# Patient Record
Sex: Male | Born: 1935 | Race: White | Hispanic: No | Marital: Married | Smoking: Former smoker
Health system: Southern US, Community
[De-identification: ages and names within clinical notes are randomized; demographics above are authoritative.]

## PROBLEM LIST (undated history)

## (undated) DIAGNOSIS — J189 Pneumonia, unspecified organism: Secondary | ICD-10-CM

## (undated) DIAGNOSIS — I10 Essential (primary) hypertension: Secondary | ICD-10-CM

## (undated) DIAGNOSIS — N400 Enlarged prostate without lower urinary tract symptoms: Secondary | ICD-10-CM

## (undated) DIAGNOSIS — G609 Hereditary and idiopathic neuropathy, unspecified: Secondary | ICD-10-CM

## (undated) DIAGNOSIS — I2089 Other forms of angina pectoris: Secondary | ICD-10-CM

## (undated) DIAGNOSIS — F0390 Unspecified dementia without behavioral disturbance: Secondary | ICD-10-CM

## (undated) DIAGNOSIS — M719 Bursopathy, unspecified: Secondary | ICD-10-CM

## (undated) DIAGNOSIS — I2510 Atherosclerotic heart disease of native coronary artery without angina pectoris: Secondary | ICD-10-CM

## (undated) DIAGNOSIS — E785 Hyperlipidemia, unspecified: Secondary | ICD-10-CM

## (undated) DIAGNOSIS — C4490 Unspecified malignant neoplasm of skin, unspecified: Secondary | ICD-10-CM

## (undated) DIAGNOSIS — I639 Cerebral infarction, unspecified: Secondary | ICD-10-CM

## (undated) DIAGNOSIS — I208 Other forms of angina pectoris: Secondary | ICD-10-CM

## (undated) DIAGNOSIS — G629 Polyneuropathy, unspecified: Secondary | ICD-10-CM

## (undated) HISTORY — PX: CORONARY ANGIOPLASTY: SHX604

## (undated) HISTORY — PX: INGUINAL HERNIA REPAIR: SUR1180

---

## 1985-12-24 HISTORY — PX: APPENDECTOMY: SHX54

## 1991-12-25 HISTORY — PX: TRANSURETHRAL RESECTION OF PROSTATE: SHX73

## 1995-12-25 HISTORY — PX: CORONARY ARTERY BYPASS GRAFT: SHX141

## 2001-02-20 ENCOUNTER — Encounter: Payer: Self-pay | Admitting: Cardiothoracic Surgery

## 2001-02-20 ENCOUNTER — Encounter: Admission: RE | Admit: 2001-02-20 | Discharge: 2001-02-20 | Payer: Self-pay | Admitting: Cardiothoracic Surgery

## 2001-02-21 ENCOUNTER — Encounter: Payer: Self-pay | Admitting: Cardiothoracic Surgery

## 2001-02-21 HISTORY — PX: RECONSTRUCTIVE REPAIR STERNAL: SUR1094

## 2001-02-25 ENCOUNTER — Inpatient Hospital Stay (HOSPITAL_COMMUNITY): Admission: RE | Admit: 2001-02-25 | Discharge: 2001-02-27 | Payer: Self-pay | Admitting: Cardiothoracic Surgery

## 2001-02-25 ENCOUNTER — Encounter: Payer: Self-pay | Admitting: Cardiothoracic Surgery

## 2001-02-26 ENCOUNTER — Encounter: Payer: Self-pay | Admitting: Cardiothoracic Surgery

## 2001-02-27 ENCOUNTER — Encounter: Payer: Self-pay | Admitting: Cardiothoracic Surgery

## 2001-08-07 ENCOUNTER — Encounter: Payer: Self-pay | Admitting: Cardiothoracic Surgery

## 2001-08-07 ENCOUNTER — Encounter: Admission: RE | Admit: 2001-08-07 | Discharge: 2001-08-07 | Payer: Self-pay | Admitting: Cardiothoracic Surgery

## 2002-02-02 ENCOUNTER — Observation Stay (HOSPITAL_COMMUNITY): Admission: RE | Admit: 2002-02-02 | Discharge: 2002-02-03 | Payer: Self-pay | Admitting: Surgery

## 2002-02-02 ENCOUNTER — Encounter (INDEPENDENT_AMBULATORY_CARE_PROVIDER_SITE_OTHER): Payer: Self-pay | Admitting: Specialist

## 2002-02-02 ENCOUNTER — Encounter: Payer: Self-pay | Admitting: Surgery

## 2004-11-02 ENCOUNTER — Ambulatory Visit (HOSPITAL_COMMUNITY): Admission: RE | Admit: 2004-11-02 | Discharge: 2004-11-02 | Payer: Self-pay | Admitting: *Deleted

## 2004-11-02 ENCOUNTER — Encounter (INDEPENDENT_AMBULATORY_CARE_PROVIDER_SITE_OTHER): Payer: Self-pay | Admitting: *Deleted

## 2005-05-24 ENCOUNTER — Encounter: Admission: RE | Admit: 2005-05-24 | Discharge: 2005-05-24 | Payer: Self-pay | Admitting: Cardiothoracic Surgery

## 2006-04-17 ENCOUNTER — Emergency Department (HOSPITAL_COMMUNITY): Admission: EM | Admit: 2006-04-17 | Discharge: 2006-04-17 | Payer: Self-pay | Admitting: Emergency Medicine

## 2008-11-17 ENCOUNTER — Emergency Department (HOSPITAL_COMMUNITY): Admission: EM | Admit: 2008-11-17 | Discharge: 2008-11-18 | Payer: Self-pay | Admitting: Emergency Medicine

## 2008-12-06 ENCOUNTER — Observation Stay (HOSPITAL_COMMUNITY): Admission: EM | Admit: 2008-12-06 | Discharge: 2008-12-07 | Payer: Self-pay | Admitting: Emergency Medicine

## 2010-02-23 ENCOUNTER — Encounter: Admission: RE | Admit: 2010-02-23 | Discharge: 2010-03-30 | Payer: Self-pay | Admitting: Neurology

## 2011-02-28 ENCOUNTER — Inpatient Hospital Stay (HOSPITAL_COMMUNITY)
Admission: EM | Admit: 2011-02-28 | Discharge: 2011-03-02 | DRG: 066 | Disposition: A | Discharge status: Home Health | Payer: Medicare Other | Attending: Internal Medicine | Admitting: Internal Medicine

## 2011-02-28 ENCOUNTER — Emergency Department (HOSPITAL_COMMUNITY): Payer: Medicare Other

## 2011-02-28 DIAGNOSIS — I2510 Atherosclerotic heart disease of native coronary artery without angina pectoris: Secondary | ICD-10-CM | POA: Diagnosis present

## 2011-02-28 DIAGNOSIS — H532 Diplopia: Secondary | ICD-10-CM | POA: Diagnosis present

## 2011-02-28 DIAGNOSIS — E119 Type 2 diabetes mellitus without complications: Secondary | ICD-10-CM | POA: Diagnosis present

## 2011-02-28 DIAGNOSIS — N400 Enlarged prostate without lower urinary tract symptoms: Secondary | ICD-10-CM | POA: Diagnosis present

## 2011-02-28 DIAGNOSIS — Z7902 Long term (current) use of antithrombotics/antiplatelets: Secondary | ICD-10-CM

## 2011-02-28 DIAGNOSIS — G609 Hereditary and idiopathic neuropathy, unspecified: Secondary | ICD-10-CM | POA: Diagnosis present

## 2011-02-28 DIAGNOSIS — I10 Essential (primary) hypertension: Secondary | ICD-10-CM | POA: Diagnosis present

## 2011-02-28 DIAGNOSIS — Z951 Presence of aortocoronary bypass graft: Secondary | ICD-10-CM

## 2011-02-28 DIAGNOSIS — E785 Hyperlipidemia, unspecified: Secondary | ICD-10-CM | POA: Diagnosis present

## 2011-02-28 DIAGNOSIS — I6330 Cerebral infarction due to thrombosis of unspecified cerebral artery: Principal | ICD-10-CM | POA: Diagnosis present

## 2011-02-28 LAB — CBC
HCT: 42.4 % (ref 39.0–52.0)
Hemoglobin: 14.7 g/dL (ref 13.0–17.0)
Hemoglobin: 14 g/dL (ref 13.0–17.0)
MCHC: 33.5 g/dL (ref 30.0–36.0)
MCHC: 33 g/dL (ref 30.0–36.0)
MCV: 94.4 fL (ref 78.0–100.0)
Platelets: 222 10*3/uL (ref 150–400)
Platelets: 230 10*3/uL (ref 150–400)
RBC: 4.65 MIL/uL (ref 4.22–5.81)
RBC: 4.5 MIL/uL (ref 4.22–5.81)
RDW: 13.5 % (ref 11.5–15.5)
RDW: 13.4 % (ref 11.5–15.5)
WBC: 11 10*3/uL — ABNORMAL HIGH (ref 4.0–10.5)

## 2011-02-28 LAB — COMPREHENSIVE METABOLIC PANEL
AST: 19 U/L (ref 0–37)
Albumin: 3.6 g/dL (ref 3.5–5.2)
Albumin: 3.5 g/dL (ref 3.5–5.2)
BUN: 17 mg/dL (ref 6–23)
CO2: 22 mEq/L (ref 19–32)
CO2: 25 mEq/L (ref 19–32)
Calcium: 8.5 mg/dL (ref 8.4–10.5)
Calcium: 8.5 mg/dL (ref 8.4–10.5)
Chloride: 107 mEq/L (ref 96–112)
Creatinine, Ser: 1.25 mg/dL (ref 0.4–1.5)
GFR calc Af Amer: >60 mL/min (ref >60)
GFR calc non Af Amer: 53 mL/min — ABNORMAL LOW (ref >60)
GFR calc non Af Amer: 55 mL/min — ABNORMAL LOW (ref >60)
Potassium: 3.9 mEq/L (ref 3.5–5.1)
Sodium: 138 mEq/L (ref 135–145)
Total Bilirubin: 0.9 mg/dL (ref 0.3–1.2)

## 2011-02-28 LAB — DIFFERENTIAL
Basophils Absolute: 0 10*3/uL (ref 0.0–0.1)
Basophils Relative: 0 % (ref 0–1)
Lymphocytes Relative: 11 % — ABNORMAL LOW (ref 12–46)
Lymphs Abs: 1.3 10*3/uL (ref 0.7–4.0)
Monocytes Relative: 6 % (ref 3–12)

## 2011-02-28 LAB — POCT I-STAT, CHEM 8
Creatinine, Ser: 1.4 mg/dL (ref 0.4–1.5)
HCT: 45 % (ref 39.0–52.0)
TCO2: 21 mmol/L (ref 0–100)

## 2011-02-28 LAB — CK TOTAL AND CKMB (NOT AT ARMC)
CK, MB: 3.3 ng/mL (ref 0.3–4.0)
Total CK: 82 U/L (ref 7–232)

## 2011-02-28 LAB — TROPONIN I: Troponin I: 0.01 ng/mL (ref 0.00–0.06)

## 2011-02-28 LAB — CARDIAC PANEL(CRET KIN+CKTOT+MB+TROPI): Total CK: 66 U/L (ref 7–232)

## 2011-02-28 LAB — PROTIME-INR
INR: 1.13 (ref 0.00–1.49)
Prothrombin Time: 14.7 seconds (ref 11.6–15.2)
Prothrombin Time: 14.6 seconds (ref 11.6–15.2)

## 2011-02-28 LAB — APTT: aPTT: 27 seconds (ref 24–37)

## 2011-03-01 ENCOUNTER — Inpatient Hospital Stay (HOSPITAL_COMMUNITY): Payer: Medicare Other

## 2011-03-01 LAB — LIPID PANEL
HDL: 37 mg/dL — ABNORMAL LOW (ref >39)
LDL Cholesterol: 70 mg/dL (ref 0–99)
Total CHOL/HDL Ratio: 3.2 RATIO
Triglycerides: 51 mg/dL (ref <150)
VLDL: 10 mg/dL (ref 0–40)

## 2011-03-01 LAB — HEMOGLOBIN A1C: Hgb A1c MFr Bld: 6.3 % — ABNORMAL HIGH (ref <5.7)

## 2011-03-01 NOTE — H&P (Signed)
NAME:  Nathan Willis, Nathan Willis NO.:  [id number]  MEDICAL RECORD NO.:  [id number]           PATIENT TYPE:  E  LOCATION:  [facility]                         FACILITY:  [facility]  PHYSICIAN:  Vania Rea, M.D. DATE OF BIRTH:  1922/06/08  DATE OF ADMISSION:  02/28/2011 DATE OF DISCHARGE:                             HISTORY & PHYSICAL   PRIMARY CARE PHYSICIAN:  Caryn Bee L. Little, M.D. with [facility].  CARDIOLOGIST:  Dr. Katrinka Blazing with [facility].  UROLOGIST:  Dr. Aldean Ast, [facility].  CHIEF COMPLAINT:  Acute onset double vision and ataxia.  HISTORY OF PRESENT ILLNESS:  This is an 77 year old Caucasian gentleman who while watching television this morning, noticed at about 9:00 a.m., he had sudden onset of double vision.  The patient could only see clearly by keeping one eye closed.  He had no headache.  No ataxia.  No chest pain, shortness of breath, dizziness associated with this. Despite this, the patient continued with his normal activities and in fact drove his wife to church and back and then on going back to pick up his wife when he got out of the car, noticed that he had difficulty maintaining his balance and fell.  According to his wife, he was walking with one eye closed and was staggering around in circles.  He fell. There was something wrong with his eyes and they called his ophthalmologic to make an appointment and an appointment was made for the following day.  As his wife felt he was getting worse, they called back and managed to get and see the doctor at 1 p.m. today.  On arrival to the doctor, he was evaluated and told by the doctor who felt he was having a stroke and transferred to [facility] by ambulance.  Because the full history has not been obtained, the patient was felt to be at cold stroke, but on initial evaluation in the Emergency Room, he was felt he was out of the window for TPA.  An MRI was ordered and the hospitalist service was called to  assist with further management.  Other than the double vision and ataxia, the patient denies any other acute problems.  He has no focal lateralizing weakness.  He is having no disorder of speech nor disorder of swallowing.  He has been having no fever, cough, or cold.  PAST MEDICAL HISTORY: 1. Coronary artery disease status post CABG in 1997. 2. 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 3. Peripheral neuropathy. 4. Recent diagnosis of borderline diabetes. 5. Benign prostatic hypertrophy, status post TURP. 6. Hyperlipidemia. 7. Hypertension. 8. Status post appendectomy, status post hernia repair.  MEDICATIONS:  Include: 1. Metoprolol succinate 25 mg daily. 2. Imdur 120 mg daily. 3. Finasteride 5 mg daily. 4. Niaspan 1000 mg at bedtime.  ALLERGIES:  MORPHINE causes him to become very aggressive and violent. He is also allergic to CODEINE, LIPITOR, ZOCOR, BAYCOL and LEXAPRO.  SOCIAL HISTORY: 1. Denies tobacco, alcohol, or illicit drug use.  Discontinued alcohol     over 15 years ago.  Used to drink occasionally. 2. He has worked  for 28 years at [employer].  Now     of course retired and living with his elderly wife.  FAMILY HISTORY:  He is an only child and his parents were very healthy.  REVIEW OF SYSTEMS:  Other than noted above significant only for peripheral neuropathy.  He has recently noted increased lower extremity edema bilaterally.  PHYSICAL EXAMINATION:  GENERAL:  A very pleasant elderly Caucasian gentleman, lying on the stretcher. VITAL SIGNS:  Temperature is 98.7, pulse 78, respiration 16, blood pressure 122/52.  He is saturating at 96% on 2 L.  He is alert and oriented x4. HEENT:  His pupils round and equal about 2 mm and reactive.  No cervical lymphadenopathy.  No thyromegaly.  He does have prominent what appear to be slightly glands, submandibular bilaterally.  No carotid bruit. CHEST:  Clear to auscultation  bilaterally. CARDIOVASCULAR:  He does have an irregular rhythm. ABDOMEN:  Obese, soft, nontender.  No masses.  Normoactive bowel sounds. EXTREMITIES: He has arthritic deformities of the hands and knees.  He has 1+ soft pitting edema up to lower to third of his leg.  Dorsalis pedis pulses at 2+ and bounding. SKIN:  Warm, dry.  He has abrasions of his right forearm where he fell and he has a bruise on also.  An old bruise on his left forearm. CENTRAL NERVOUS SYSTEM:  No field the deficit was elicited.  However, he had difficulty with focused because of his apparent double vision and was much better with one eyes closed.  He has no facial asymmetry. Cranial nerves III through XII appear grossly intact.  He has no focal lateralizing signs in his upper or lower extremity.  LABORATORY DATA:  His white count is 11.4, hemoglobin 14.7, platelets 222.  He has 83% neutrophils.  Absolute neutrophil count elevated to 9.5.  His sodium is 134, potassium 4.4, chloride 107, CO2 of 22, glucose elevated at 153, BUN 20, creatinine 1.28.  Total protein is 5.8, albumin 3.6.  Calcium is 8.5.  His liver functions otherwise unremarkable.  His cardiac enzymes completely normal.  MRI of the brain showed no acute findings ventricular size and if the bases were normal for his age.  MRI of the brain shows cerebral acute non hemorrhagic infarct involving the posterior circulation distribution including the occipital lobe bilaterally.  The right thalamus, the medial posterior left temporal lobe, and the left cerebellum.  MRA of his head showed intracranial atherosclerotic type changes, questionable occlusion of the left vertebral artery dissection cannot be completely excluded to the cause of his appear normal.  ASSESSMENT: 1. 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 2. Diplopia and ataxia due to the above. 3. Hypertension. 4. Borderline diabetes. 5. Coronary artery  disease. 6. Benign prostatic hypertrophy. 7. Peripheral neuropathy. 8. Lower extremity edema.  PLAN:  This relatively healthy gentleman will be admitted to the Neuro Telemetry Unit.  We will give aspirin for antiplatelet therapy and I have discussed his MRI with the neuro hospitalist and they will continue to follow the patient in hospital.  We will continue his cardiac medications in fact all his chronic home medications and will hydrate him.  We will cautiously and will check a 2-D echo.  Other plans as per orders.     Vania Rea, M.D.     LC/MEDQ  D:  02/28/2011  T:  02/28/2011  Job:  [id number]  cc:   Caryn Bee L. Little, M.D. Lyn Records,  M.D. Levie Heritage, MD  Electronically Signed by Vania Rea M.D. on 03/01/2011 02:36:15 AM

## 2011-03-02 NOTE — Consult Note (Addendum)
NAME:  Nathan Willis, HESSLING NO.:  [id number]  MEDICAL RECORD NO.:  [id number]           PATIENT TYPE:  I  LOCATION:  3004                         FACILITY:  [facility]  PHYSICIAN:  Levie Heritage, MD       DATE OF BIRTH:  07-05-1922  DATE OF CONSULTATION:  02/28/2011 DATE OF DISCHARGE:                                CONSULTATION   REASON FOR CONSULTATION:  Rule out acute stroke/code stroke.  CHIEF COMPLAINT:  Sudden onset of double vision.  HISTORY OF PRESENT ILLNESS:  This patient is a 77 year old man with multiple comorbidities who was at his baseline around 9:00 a.m. this morning while watching TV, he suddenly noticed that he is having horizontal double vision off and on.  He did not notice much of any other changes, but when he started to walk, he felt little wobbly and actually he had a fall on his left side.  He and his wife did not observe any other weakness or sensory changes, so thinking of the eye problem he drove himself to his eye Dr. Nile Riggs who referred him back here to be [facility].  The symptom onset was 9 a.m. or around that time, and he was evaluated by myself at 2 p.m. and before that he had a negative CAT scan of the head in the emergency and his symptoms of the vision had resolved.  PAST MEDICAL HISTORY:  Coronary artery disease, hypercholesterolemia, benign prostatic hypertrophy, hypertension, neuropathy, he has had hernia repair, and appendectomy in remote past.  SOCIAL HISTORY:  He lives with his wife.  He is a retired Occupational hygienist, and denies illicit drug abuse, alcohol, or tobacco use.  FAMILY HISTORY:  His mother was murdered.  Father died at age 65 without any specific comorbidities.  ALLERGIES:  The patient is allergic to MORPHINE, CODEINE, LIPITOR, ZOCOR, BAYCOL, AND LESCOL.  CURRENT MEDICATIONS:  He is taking: 1. Finasteride 5 mg daily basis. 2. Niaspan, he does not remember the exact dose. 3. He takes baby aspirin 81 mg  daily. 4. He takes isosorbide and metoprolol 25 mg daily.  REVIEW OF SYSTEMS:  He denies any other active issues these days, except those in relation to his past medical history those mentioned in the HPI.  PHYSICAL EXAMINATION:  GENERAL:  The patient is comfortably lying on the bed and currently his NIHSS stroke scale at 2:00 p.m. is zero. VITAL SIGNS:  Blood pressure right now 118/68 mmHg, he is afebrile and 94% on the room air, he is in no acute distress. HEENT:  Clear sclerae with no obvious facial deformity; however, he does have redness on the left side of the forehead where he had a fall this morning. NECK:  No JVD or thyromegaly. CHEST:  Clear to auscultation bilaterally, at least anteriorly. HEART:  s1+s2+0 ABDOMEN:  Soft, nontender, without any organomegaly. EXTREMITIES:  Without any edema. NEUROLOGIC:  The patient is awake, oriented x3, in no acute distress. He is comfortably lying on the bed.  Cranial nerves examination II through XII are grossly intact.  He has bilateral pupils are reactive. He moves both eyes to all  directions and currently denies any diplopia. His tongue is midline.  His face is symmetrical.  The palate elevation is symmetrical as well.  He has intact shoulder elevation strength as well bilaterally.  The motor strength is without any drift and he has no focal weakness and his strength is 5/5 all over. SENSORY EXAMINATION:  He has altered sensation all the way from toes to the below-knee level, but it is because of his known chronic neuropathy. Otherwise, he has intact sensation all over to light touch.  His gait was not tested.  LABORATORY WORKUP:  I have reviewed his CT scan of the head myself performed in the emergency and found no acute findings.  The lab result showed mild elevation of the WBC of 11.4, but otherwise unremarkable. Sodium 135, potassium 4.4, chloride 107, carbone dioxide 22, glucose 153, BUN 20, and creatinine  1.28.  IMPRESSION:  This is an 77 year old man with multiple comorbidities who had sudden onset of horizontal diplopia that resolved without any intervention at its own and has no currently neurological deficit and has no acute abnormality on the CT scan of the head.  My impression is that he likely had CNS posterior ciculation ischemia. although the resoulution of clinical symptoms argues against stroke but the possibility cannot be ruled out in this setting.  PLAN: 1. Please admit the patient for observation for at least 24 hours and if his MRI shows stroke , proceed accordingly. 2. Please perform the bedside elevation and if he passes, give him     aspirin 325 mg orally and if he is unable to swallow, please give     the same medicine rectally. 3. Stop all the blood pressure medication and no need to treat blood     pressure if his systolic BP is less than 200 mmHg. 4. Please perform the patient's MRI and MRA of the brain for further     evaluation of ischemic event. 5. Please send the fasting lipid panel in the morning. 6. Cardiac echo and Doppler carotids can be performed as well this on     the above.  Thanks for calling Neurology to evaluate this patient and Dr. Pearlean Brownie will follow him from tomorrow morning, but please do not hesitate to give me a call if you have any question as well.          ______________________________ Levie Heritage, MD     WS/MEDQ  D:  02/28/2011  T:  03/01/2011  Job:  [id number]  Electronically Signed by Levie Heritage MD on 03/01/2011 09:56:44 AM

## 2011-03-04 NOTE — Discharge Summary (Signed)
NAME:  Nathan Willis, RUNNION NO.:  [id number]  MEDICAL RECORD NO.:  [id number]           PATIENT TYPE:  I  LOCATION:  3004                         FACILITY:  [facility]  PHYSICIAN:  Andreas Blower, MD       DATE OF BIRTH:  [birth year]  DATE OF ADMISSION:  02/28/2011 DATE OF DISCHARGE:  03/02/2011                              DISCHARGE SUMMARY   PRIMARY CARE PHYSICIAN:  Caryn Bee L. Little, MD  CARDIOLOGIST:  Lyn Records, MD  UROLOGIST:  Courtney Paris, MD  NEUROLOGIST:  Sunny Schlein. Pearlean Brownie, MD  DISCHARGE DIAGNOSES: 1. Several acute non-hemorrhagic infarcts involving the posterior     circulation distribution. 2. Hypertension. 3. Borderline diabetes. 4. History of coronary artery disease. 5. Benign prostatic hypertrophy. 6. Peripheral neuropathy. 7. Lower extremity edema. 8. Right arm wound. 9. Right elbow wound from a fall. 10.History of appendectomy. 11.History of recurrent chest wound dehiscence with subsequent chronic     chest pain. 12.Grade 1 diastolic dysfunction by a 2-D echocardiogram.  DISCHARGE MEDICATIONS: 1. Plavix 75 mg p.o. daily. 2. Finasteride 5 mg p.o. daily. 3. Isosorbide mononitrate extended release 120 mg p.o. daily 4. Metoprolol extended release 25 mg daily. 5. Niacin sustained release 1000 mg p.o. daily at bedtime.  BRIEF ADMITTING HISTORY AND PHYSICAL:  Nathan Willis is an 77 year old Caucasian gentleman who presented on February 28, 2011, when he started having double vision while watching TV.  RADIOLOGY/IMAGING:  The patient had head CT without contrast on February 28, 2011, which shows no acute findings. The patient had MRI/MRA of the head without contrast, which showed a several acute non-hemorrhagic infarcts involving posterior circulation distribution.  MRA showed intracranial atherosclerotic type changes as noted above.  The patient had carotid Dopplers on March 01, 2011, which shows no significant extracranial carotid artery stent  stenosis demonstrated. Vertebrals are patent with antegrade flow.  LABORATORY DATA:  CBC shows white count of 11.0, hemoglobin 14.0, hematocrit 42.4, platelet count 230, INR 1.12.  Electrolytes normal with a creatinine of 1.23.  Liver function tests normal except total protein is 5.8, hemoglobin A1c 6.3.  Troponins negative x2.  LDL is 70.  CONSULTATIONS:  Neurology, Dr. Pearlean Brownie was following the patient during the course of the hospital stay.  The patient had a transesophageal echocardiogram on March 01, 2011, which shows no cardiac source of emboli was identified.  The patient had a 2-D echocardiogram on March 01, 2011, which showed left ventricle cavity size was normal.  Mild concentric hypertrophy. Systolic function was mildly reduced.  Ejection fraction was 45% to 50%, no diagnostic regional wall motion abnormality was identified.  Doppler parameters were consistent with abnormal left ventricular relaxation (grade 1 diastolic dysfunction).  Left atrium was mildly dilated.  HOSPITAL COURSE BY PROBLEM: 1. Acute CVA.  Neurology was consulted.  The patient had the above     imaging.  The patient was evaluated by PT, OT, and Speech Therapy.     Neurology changed his aspirin from Plavix at the time of discharge.     Given frequent PVCs on EKG, questioned the source of emboli as the  result TEE was obtained with results having been dictated above.     At the time of discharge, Neurology recommended the patient to have     cardiac telemetry for 3 weeks for possible paroxysmal AFib.  The     patient to follow up with [facility] cardiology for further cardiac     monitor.  [facility] notified and will arrange for cardiac     monitor to be done as an outpatient. 2. Hypertension.  Blood pressure stable.  Continue the patient on     current medications. 3. Borderline diabetes, stable diet controlled. 4. History of coronary artery disease, stable.  Troponins negative x2.     The patient to  follow up with Cardiology as an outpatient. 5. BPH, stable. 6. Peripheral neuropathy.  Continue the patient's home medications. 7. Right arm wound from fall.  Wound care.  DISPOSITION AND FOLLOWUP:  The patient to follow up with his primary care physician, Dr. Clarene Duke in 1 week.  The patient to go to Dr. Michaelle Copas office next week for cardiac monitor.  His office will call when ready. The patient to follow up with Dr. Pearlean Brownie in about 4 weeks.  Time spent on discharge talking to the patient's family and coordinating care was 40 minutes.   Andreas Blower, MD   SR/MEDQ  D:  03/02/2011  T:  03/03/2011  Job:  [id number]  cc:   Caryn Bee L. Little, M.D. Lyn Records, M.D. Courtney Paris, M.D. Pramod P. Pearlean Brownie, MD  Electronically Signed by Wardell Heath Jalien Weakland  on 03/04/2011 09:00:22 PM

## 2011-03-09 NOTE — Consult Note (Signed)
NAME:  Nathan Willis, Nathan Willis NO.:  [id number]  MEDICAL RECORD NO.:  [id number]           PATIENT TYPE:  I  LOCATION:  3004                         FACILITY:  [facility]  PHYSICIAN:  Taurus Willis P. Pearlean Brownie, MD    DATE OF BIRTH:  1922/11/29  DATE OF CONSULTATION:  02/28/2011 DATE OF DISCHARGE:                                CONSULTATION   REFERRING PHYSICIAN:  [facility].  REASON FOR REFERRAL:  Strokes.  HISTORY OF PRESENT ILLNESS:  Mr. Gullett is a 88-year Caucasian gentleman who states he was sitting watching TV yesterday at 9:11 a.m. and developed sudden onset of horizontal diplopia and as he tried to stand up, he felt dizzy and off balance, he in fact fell twice and sustained abrasions.  He initially went to his primary physician, Dr. Ashley Royalty office who evaluated him and thought he was having stroke and referred him to [facility].  He was brought in as a Code Stroke, but just presented beyond time window for t-PA, 3-hour window, but his symptoms also improved upon admission when he was seen by Dr. Levie Heritage, neurologist on call.  He had NIH stroke scale of zero, but when the patient sat up, he became dizzy and symptoms returned, and he was given fluid bolus, but states that his double vision has not gone away completely, and he still feels dizzy when he sits up.  He has no known prior history of stroke, TIA, seizures, significant neurological problems.  PAST MEDICAL HISTORY:  Significant for hyperlipidemia and coronary artery disease.  PAST SURGICAL HISTORY:  CABG.  HOME MEDICATIONS:  Niaspan and aspirin.  SOCIAL HISTORY:  Lives with his wife, independent in activities of daily living.  Does not smoke or drink.  REVIEW OF SYSTEMS:  Negative for recent fever, cough, chest pain, diarrhea, or illness.  PHYSICAL EXAMINATION:  GENERAL:  A pleasant Caucasian elderly gentleman who is not in distress.  He is afebrile with pulse rate today 78 per minute and  regular, blood pressure 133/86, respiratory rate 18 per minute, distal pulses well felt. HEAD:  Nontraumatic. EARS:  Hearing is decreased bilaterally mildly. NECK:  Supple without bruits. CARDIAC:  Regular heart sounds.  No murmur or gallop. LUNGS:  Clear to auscultation. NEUROLOGIC:  He is awake, alert.  He is oriented to time, place, and person.  There is minimum dysarthria, but he can be easily understood. He has left internuclear ophthalmoplegia with the left eye not doing adduction with few beats of nystagmus when he looks to the right.  There is minimum right lower facial asymmetry.  Tongue is midline.  Motor system exam reveals no upper or lower extremity drift.  He has symmetric and equal strength in all four extremities.  Coordination is slow but accurate.  Gait was not tested.  DATA REVIEWED:  MRI scan of the brain shows small bilateral infarcts involving the cerebellum and posterior circulation mainly.  MRA of the brain shows mild ectasia of the right vertebral artery and basilar artery with only mild area of narrowing.  No large vessel stenosis is noted.  Carotid ultrasound and 2D echo are pending  at this time.  Lipid profile, hemoglobin A1c are pending as well.  IMPRESSION:  An 77 year old gentleman with multiple small posterior circulation infarcts of unknown but possibly embolic etiology.  There is no significant intracranial vascular stenosis identified.  PLAN:  I would recommend further evaluation by checking transesophageal echocardiogram and continue telemetry monitoring for cardiac source of embolism.  Change aspirin to Plavix for secondary stroke prevention. Maintain strict control of hypertension and hyperlipidemia.  Physical, occupational therapy consults.  We will be happy to follow the patient. Kindly call for questions.     Rykin Route P. Pearlean Brownie, MD     PPS/MEDQ  D:  03/01/2011  T:  03/02/2011  Job:  [id number]  Electronically Signed by Delia Heady MD on  03/09/2011 11:17:55 AM

## 2011-05-08 NOTE — H&P (Signed)
NAME:  Nathan Willis, Nathan Willis NO.:  [id number]   MEDICAL RECORD NO.:  [id number]          PATIENT TYPE:  INP   LOCATION:  3738                         FACILITY:  [facility]   PHYSICIAN:  Lyn Records, M.D.   DATE OF BIRTH:  [birth year]   DATE OF ADMISSION:  12/06/2008  DATE OF DISCHARGE:                              HISTORY & PHYSICAL   CHIEF COMPLAINT:  Chest pain.   HISTORY OF PRESENT ILLNESS:  Mr. Hobin is an 77 year old male patient  with a known history of coronary artery disease who underwent bypass  surgery around 1997 and had wound dehiscence and underwent re-  exploration of the mediastinum with rewiring.  Because of the sternal  problems, he has had constant grinding and constant chest pain.  He says  that he recently has had a pressure that is new.  He went to his primary  care physician's office today and saw Dr. Uvaldo Rising who felt that the  patient should be seen in the emergency room and admitted.  He was  therefore sent to [facility].   He does state that he has been getting chest pressure going on for weeks  at a time.  But now it occurs at rest.  This has scared him, and he has  come for medical evaluation.  He was seen at the  [facility] ER on  November 18, 2008, with chest pain.  He said the nitroglycerine helped  at that time.  He was ruled out for MI and he was sent home.   PAST MEDICAL HISTORY:  1. CAD versus CABG as above.  2. Hypercholesterolemia.  3. BPH.  4. Hypertension.  5. Neuropathy, lower extremity.  6. Status post appendectomy.  7. Hernia repair.  8. Reexploration of the mediastinum with rewiring.   SOCIAL HISTORY:  He lives in [place] with his wife.  He is a retired  Occupational hygienist.  He denies any tobacco, alcohol, or illicit drug use.   FAMILY HISTORY:  Mother died, she was murdered.  Father died at age 94  of old age.   ALLERGIES:  MORPHINE, CODEINE, LIPITOR, ZOCOR, BAYCOL, AND LESCOL.   MEDICATIONS:  1. Finasteride 5 mg.  2.  Niacin 500 mg, 2 tablets nightly.  3. Baby aspirin daily.   REVIEW OF SYSTEMS:  Chest pain as above, otherwise negative.   PHYSICAL EXAMINATION:  VITAL SIGNS:  Temperature 97.2, pulse 81,  respirations 18.  Blood pressure 162/97, O2 saturations 97% on room air.  GENERAL:  He is in no acute distress.  HEENT:  Grossly normal.  Sclerae clear.  Conjunctivae normal.  Nares  clear without drainage.  NECK:  No carotid or subclavian bruits.  No JVD or thyromegaly.  CHEST :  Clear to auscultation bilaterally.  No wheezing or rhonchi.  HEART:  Regular rate and rhythm.  No murmur, rub, or ectopy.  ABDOMEN:  Normoactive bowel sounds.  Nontender, nondistended.  No  masses, no bruits.  EXTREMITY:  No lower extremity edema.  Palpable PT, DP pulses  bilaterally.  NEUROLOGIC:  Cranial nerves II through XII grossly intact.  PSYCHIATRIC:  Normal mood and affect.   Chest x-ray on November 18, 2008, showed a left basilar atelectasis with  non-elevation of the left hemidiaphragm.   LABORATORY STUDIES:  Laboratory studies have not yet been drawn.  EKG  shows normal sinus rhythm with PAC's, ventricular rate of 84.  No acute  ST-T wave changes.   ASSESSMENT AND PLAN:  1. Typical chest pain.  2. No coronary artery disease.  3. Hypertension.  4. Hyperlipidemia.  5. Benign prostatic hypertrophy.  6. Neuropathy.  We will add a beta-blocker and add Imdur, increase      aspirin to 81 mg a day, and add Plavix for short term, probably      around 1 month.  7. We will continue to cycle cardiac enzymes.  The patient wishes      medical management in lieu of aggressive invasive approach.  8. The patient has been seen and examined by Dr. Verdis Prime.      Guy Franco, P.A.      Lyn Records, M.D.  Electronically Signed    LB/MEDQ  D:  12/06/2008  T:  12/07/2008  Job:  [id number]   cc:   Chales Salmon. Abigail Miyamoto, M.D.  Lyn Records, M.D.

## 2011-05-08 NOTE — Discharge Summary (Signed)
NAME:  [name], [name] NO.:  [id number]   MEDICAL RECORD NO.:  [id number]          PATIENT TYPE:  INP   LOCATION:  3738                         FACILITY:  [facility]   PHYSICIAN:  Lyn Records, M.D.   DATE OF BIRTH:  Oct 19, 1922   DATE OF ADMISSION:  12/06/2008  DATE OF DISCHARGE:  12/07/2008                               DISCHARGE SUMMARY   DISCHARGE DIAGNOSES:  1. Chest pain, medical management, resolved.  2. Known coronary artery disease.  3. Hypertension.  4. Hyperlipidemia.  5. Benign prostatic hypertrophy.  6. Peripheral neuropathy.   HOSPITAL COURSE:  Nathan Willis is an 77 year old male patient with  known coronary artery disease who underwent bypass surgery around 1997.  At that point, he had wound dehiscence and states that he has constant  soreness of that area.  He had to eventually undergo re-exploration of  the mediastinum and rewiring.   He now was admitted with a new type of chest pressure that has been  going on for a couple of weeks, now occurring at rest.  This scared him  and he came to the emergency room.  He actually have been seen at [facility] on November 18, 2008 for chest pain and he stated that  nitroglycerin helped.  He was sent home and his enzymes were negative at  that point.   Because of his symptoms, we admitted him to the hospital to watch him  overnight.  In a long discussion with the patient, he wished to have  medical management of his presumed coronary artery disease in lieu of an  aggressive invasive approach   The patient remained in the hospital overnight.  His cardiac enzymes  were negative and his medications were augmented and he was discharged  to home.   DISCHARGE MEDICATIONS:  1. Finasteride 5 mg a day.  2. Niaspan 2 tablets, 500 mg, at bedtime.  3. Enteric-coated aspirin 325 mg a day.  4. Plavix 75 mg a day for 30 days.  5. Toprol-XL 25 mg a day.  6. Imdur 60 mg a day.  7. Sublingual nitroglycerin p.r.n.  chest pain.   He is to remain on a low-sodium heart-healthy diet and increase  activities slowly.  Follow up with Dr. Katrinka Blazing on December 27, 2008 at 2:15  p.m.      Guy Franco, P.A.      Lyn Records, M.D.  Electronically Signed    LB/MEDQ  D:  12/07/2008  T:  12/07/2008  Job:  [id number]   cc:   Chales Salmon. Abigail Miyamoto, M.D.

## 2011-05-11 NOTE — Op Note (Signed)
[place]. [facility]  Patient:    Nathan Willis, Nathan Willis                     MRN: [id number] Proc. Date: 02/25/01 Adm. Date:  02/25/01 Attending:  Gwenith Daily. Tyrone Sage, M.D. CC:         Darci Needle, M.D.   Operative Report  PREOPERATIVE DIAGNOSES: 1. 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 2. Ventral hernia.  POSTOPERATIVE DIAGNOSES: 1. 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 2. Ventral hernia.  PROCEDURES: 1. Re-exploration of mediastinum and rewiring of nonunion of the sternum. 2. Marlex mesh repair of ventral hernia.  SURGEON:  Gwenith Daily. Tyrone Sage, M.D.  ASSISTANT:  Lissa Merlin, P.A.  BRIEF HISTORY:   Patient is a 77 year old male who in 1997 had undergone a coronary artery bypass grafting.  From a cardiac standpoint, he has done well, but over the years had noted progressive popping and clicking in the sternum. Initially this was not bothersome, but over the past several months he had noted increasing popping and clicking and discomfort, especially with use of his arms, and also when trying to sleep.  Because of the increasing discomfort, he sought medical attention.  Chest x-ray confirmed the fracturing of the sternal wires and on exam, the patient had obvious nonunion of the sternum and a ventral hernia at the lower pole of the sternal incision. Because of the discomfort involved and limitations of the patients activities, rewiring of the sternum was recommended.  The patient agreed and signed informed consent.  DESCRIPTION OF PROCEDURE:  With appropriate monitoring in place, the patient underwent general endotracheal anesthesia.  The chest and abdomen were prepped with Betadine and draped in the usual sterile manner.  The old sternotomy incision was opened and as confirmed by the preoperative studies, the sternal wires had fractured and there was a nonunion of the sternum.  Each side of  the sternum was then dissected free of underlying mediastinal and lung tissue. Using bone curettes, the thick, fibrous peel on the bone was debrided away to leave fresh bone.  The bone itself appeared viable.  There was no evidence of infection.  At the lower pole of the incision, the fascial edges had retracted and would be under too much tension to be closed primarily, so a piece of Marlex mesh was selected for repair.  A fluted drain was left in the anterior mediastinum and brought out through a separate stab wound over the upper abdominal wall.  Using #1 interrupted mattress Prolene sutures, the Marlex mesh was attached to the fascial edges at the lower pole of the sternal incision.  With this in place, the figure-of-eight sternal wires were then placed and the sternum reapproximated.  With the sternum reapproximated, the remainder of the Marlex mesh was attached to the fascial layers.  The skin incision was then closed with interrupted 0 Vicryl sutures and 3-0 Vicryl subcutaneous stitch and a 4-0 subcuticular stitch.  Dry dressings were applied.  The patient was awakened and extubated and transferred to the recovery room for postoperative care.  Sponge and needle count were reported as correct at the completion of the procedure. DD:  02/28/01 TD:  02/28/01 Job: [id number]

## 2011-05-11 NOTE — H&P (Signed)
[place]. [facility]  Patient:    Nathan Willis, Nathan Willis                     MRN: [id number] Adm. Date:  02/25/01 Attending:  Gwenith Daily. Tyrone Sage, M.D. Dictator:   Marlowe Kays, P.A. CC:         Chales Salmon. Abigail Miyamoto, M.D.  Darci Needle, M.D.   History and Physical  DATE OF BIRTH:  [birth year]  CHIEF COMPLAINT:  Sternal instability - separation.  HISTORY OF PRESENT ILLNESS:  [age]-year-old white male with a history of CAD, status post CABG in 1997, previously seen in 1998 with complaints of clicking at the lower portion of his sternal incision.  Around December 2001 the patient noted having flulike symptoms and increased cough causing significant discomfort in the lower sternal incision.  He was evaluated by Dr. Tyrone Sage.  The chest x-ray showed fracture of the lower sternal wires. Because this area is interfering with his activities of daily living, Dr. Tyrone Sage recommended to proceed with sternal rewiring, which is scheduled for February 25, 2001.  Other than mild shortness of breath, dyspnea on exertion and "clicking worse when lying on his back" the patient denies any paroxysmal nocturnal dyspnea, cough,  sputum production, anginal symptoms, nausea, vomiting, diaphoresis, fever, chills, symptoms of TIA, CVA, or amaurosis fugax.  PAST MEDICAL HISTORY: 1. CAD status post CABG. 2. Remote history of alcohol abuse. 3. Hypercholesterolemia. 4. Arthritis. 5. History of BPH. 6. Remote history of pneumonia.  SURGERIES:  Status post cardiac catheterization in 1997, Dr. Katrinka Blazing.  Status post CABG in 1997, Dr. Tyrone Sage.  Status post appendectomy secondary to peritonitis in 1987 times three.  Status post TURP in 1993.  MEDICATIONS:  Ecotrin 81 mg q.d.  Niaspan 50 mg q.d.  ALLERGIES:  MORPHINE (agitation, mental status changes).  REVIEW OF SYSTEMS:  See HPI and past medical history for significant positives.  No diabetes, kidney disease, asthma or CHF.   He complains of "feet numb for years," with unknown etiology.  FAMILY HISTORY:  Mother died at 47, she was murdered.  Father died at 43 of stroke.  Grandfather and grandmother died of MI.  SOCIAL HISTORY:  Married.  One grown child.  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  He quit 20 years ago the use of tobacco.  He denies any alcohol intake at this point.  PHYSICAL EXAMINATION:  GENERAL APPEARANCE:  Well-developed, well-nourished 77 year old white male in no acute distress, alert and oriented times three.  VITAL SIGNS:  Blood pressure 140/70.  Pulse 64.  Respirations 16.  HEENT:  Normocephalic and atraumatic.  PERRLA.  EOMI.  Mild cataracts.  No glaucoma or macular degeneration.  NECK:  Supple.  No JVD, bruits or lymphadenopathy.  CHEST:  Symmetrical with inspirations.  There is a 2.5-3 cm fascial defect in the inferior pole of the sternum with an easily reducible hernia.  There is an audible click.  LUNGS:  Show no wheezes, rhonchi or rales.  No lymphadenopathy.  CARDIOVASCULAR:  Regular rate and rhythm.  No murmurs, rubs or gallops.  ABDOMEN:  Soft and nontender.  Bowel sounds times four.  No masses or bruits.  GENITOURINARY AND RECTAL:  Deferred.  EXTREMITIES:  No clubbing or cyanosis.  One plus edema bilaterally.  No ulcerations.  Temperature; warm.  Peripheral pulses:  carotids 2+ bilaterally, femorals, popliteals, dorsalis pedis and posterior tibialis 2+ bilaterally.  NEUROLOGIC:  Nonfocal.  Gait steady.  DTRs 2+ bilaterally.  Muscle strength 5/5.  ASSESSMENT AND PLAN:  The patient will undergo sternal rewiring on February 25, 2001 at [facility] by Dr. Tyrone Sage.  Dr. Tyrone Sage has seen and evaluated this patient prior to the admission.  He was explained the risks and benefits involving the procedure and the patient has agreed to continue. DD:  02/21/01 TD:  02/21/01 Job: [id number]

## 2011-05-11 NOTE — H&P (Signed)
NAME:  Nathan Willis, REPSHER NO.:  [id number]   MEDICAL RECORD NO.:  [id number]          PATIENT TYPE:  EMS   LOCATION:  MAJO                         FACILITY:  [facility]   PHYSICIAN:  Lyn Records, M.D.   DATE OF BIRTH:  03/17/1922   DATE OF ADMISSION:  04/17/2006  DATE OF DISCHARGE:  04/17/2006                                HISTORY & PHYSICAL   CHIEF COMPLAINT:  Rapid heart rate and chest soreness/heaviness.   HISTORY OF PRESENT ILLNESS:  Nathan Willis is an 77 year old Caucasian male with  a known history of coronary artery disease, status post CABG in 1997, who  experienced a rapid heart rate last night at approximately 9 p.m.  The  elevated heart rate was sudden in onset while he was watching television and  lasted approximately 15 minutes.  He attempted to check his pulse via a  distended vein on the top of his head; however, his heart rate was too rapid  to count.  The tachycardic episode spontaneously resolved without a  recurrence.  The patient admits to two prior episodes within the last month.  Nathan Willis has a history of non-union of the sternum following CABG in 1997  and complains of chronic chest soreness and chest heaviness, which is  worsened by activity that involves lifting his arms towards his head.  Yesterday, he pulled weeds in his yard with increased intensity of his chest  soreness/heaviness.  He denies shortness of breath associated with the  episode.  He also denies diaphoresis, nausea, vomiting, dizziness, and  syncope.  In reference to his chronic chest pain, he states that he is  unsure if it is my heart.  He called Dr. Michaelle Copas office this morning and  was unable to obtain an appointment this afternoon, as Dr. Katrinka Blazing was going  to be in the hospital, so he was advised to report to the [facility]  emergency department.  He presented to the [facility] emergency department  by private car.  Upon arrival to the [facility] emergency department, a 12-  lead EKG was obtained and revealed normal sinus rhythm with a ventricular  rate of 73 beats per minute.  In May, 2002, he had a stress test that  revealed no evidence of ischemia.   PAST MEDICAL HISTORY:  1.  Coronary artery disease, status post CABG in 1997, status post non-union      of sternum following the CABG.  2.  Chronic chest soreness/heaviness secondary to nonunion of sternum.  3.  Hypertension.  4.  Hyperlipidemia.  5.  Lower extremity discomfort.  6.  Neuropathy.   ALLERGIES:  MORPHINE.   MEDICATIONS:  1.  Enteric-coated aspirin 81 mg daily.  2.  Niaspan 500 mg 2 tablets at bedtime.   PAST SURGICAL HISTORY:  1.  Status post CABG in 1997.  2.  Status post appendectomy x3.  3.  Hernia repair.  4.  Reexploration of mediastinum and rewiring of nonunion of the sternum.   FAMILY HISTORY:  Noncontributory.   SOCIAL HISTORY:  Married.  Lives with wife.  Retired.  Denies  tobacco,  alcohol, or illegal drug use.   REVIEW OF SYSTEMS:  All other systems are reviewed and are negative other  than what is stated in the HPI.   PHYSICAL EXAMINATION:  VITAL SIGNS:  Temperature 97, blood pressure 146/95,  pulse 84, respirations 26, O2 saturations 94% on room air.  GENERAL:  An 77 year old male who was anxious to leave the hospital after  arrival, otherwise pleasant and cooperative.  NAD.  HEENT:  Unremarkable.  NECK:  Supple without JVD or carotid bruits bilaterally.  LUNGS:  Breath sounds are clear and equal to auscultation bilaterally.  HEART:  Regular rate and rhythm.  S1 and S2 normal.  No murmurs, gallops,  clicks, or rubs.  ABDOMEN:  Soft, nontender, nondistended with active bowel sounds.  No bruits  or masses.  Umbilicus to the left of midline.  EXTREMITIES:  No peripheral edema.  DP/PT pulses +2/2 bilaterally.  SKIN:  Warm and dry without rashes or lesions.  NEUROLOGIC:  Alert and oriented x3.  No focal deficits.  PSYCH:  Normal mood and affect.   LABORATORY DATA:   Sodium 137, potassium 4.6, chloride 107, CO2 21.9, BUN 13,  creatinine 1.2, glucose 136.  White blood cell count 8.4, hemoglobin 16.9,  hematocrit 49.6%, platelets 259.  Serial cardiac enzymes x1:  CK total 60,  CK-MB 2.7, troponin 0.01, magnesium 2.1.   ASSESSMENT:  1.  Chest pain.  2.  Tachycardic palpitations with no recurrence since last night.  3.  Hypertension.  4.  Hyperlipidemia.  5.  Neuropathy.   PLAN:  1.  Rule out MI.  EKG:  Normal sinus rhythm with a ventricular rate of 73      beats per minute.  Serial cardiac enzymes negative x1.  2.  Questionable paroxysmal atrial fibrillation:  Heart rate 70-90s since      arrival to the emergency department.  On telemetry, normal sinus and      sinus arrhythmia.  The patient is scheduled to wear an event monitor on      an outpatient basis starting Friday, April 19, 2006 for 30 days to rule      out atrial fibrillation or any other underlying arrhythmias.  The event      monitor will be mailed directly to the patient's home with instructions.      Once the monitor has been discontinued, results will be interpreted by      Dr. Katrinka Blazing, and the patient will be notified with the results.  3.  Continue home medications.  4.  Chest x-ray, PA and lateral, cancelled in light of negative cardiac      markers and MI ruled out.  5.  Patient was interviewed, examined, assessed, and the plan was determined      by Dr. Katrinka Blazing.  Following the test results, Dr. Katrinka Blazing was notified and is      agreement with the discharge plan.  The patient was discharged to home      in stable condition.  It is believed that his chronic chest      soreness/heaviness is more musculoskeletal in nature secondary to his      nonunion of the sternum and was exacerbated by his activity in the yard      yesterday.  6.  The patient was discharged to home with specifically printed      instructions about next steps.     Tylene Fantasia, [place]      Lyn Records,  M.D.  Electronically Signed    RDM/MEDQ  D:  04/17/2006  T:  04/17/2006  Job:  [id number]   cc:   Chales Salmon. Abigail Miyamoto, M.D.  Fax: [phone]

## 2011-05-11 NOTE — Op Note (Signed)
[place]  Patient:    ZEFERINO, MOUNTS Visit Number: [id number] MRN: [id number]          Service Type: SUR Location: 3W [id number] 01 Attending Physician:  Andre Lefort Dictated by:   Sandria Bales. Ezzard Standing, M.D. Proc. Date: 02/02/02 Admit Date:  02/02/2002   CC:         Chales Salmon. Abigail Miyamoto, M.D.  Darci Needle, M.D.   Operative Report  DATE OF BIRTH:  1922/12/04. CCS [id number].  PREOPERATIVE DIAGNOSIS:  Symptomatic cholelithiasis with elevated liver enzymes.  POSTOPERATIVE DIAGNOSIS:  Severe chronic cholecystitis (very thick-walled gallbladder and chronic disease) with gallstones.  PROCEDURE:  Laparoscopic cholecystectomy with intraoperative cholangiogram.  SURGEON:  Sandria Bales. Ezzard Standing, M.D.  FIRST ASSISTANT:  Sharlet Salina T. Hoxworth, M.D.  ANESTHESIA:  General endotracheal.  ESTIMATED BLOOD LOSS:  Minimal.  INDICATION FOR PROCEDURE:  Mr. Milich is a 77 year old white male who has had at least a single episode of epigastric abdominal pain, noted to have some elevated liver functions.  CT scan of his abdomen showed some perinephric stranding, questionable gallbladder sludge.  He then underwent an ultrasound of his gallbladder on December 19, 2001, which showed a small, contracted gallbladder filled with echogenic defects consistent with cholelithiasis.  The common bile duct was normal.  Discussion carried out with the patient about the options for treatment, and he now comes for attempted laparoscopic cholecystectomy.  DESCRIPTION OF PROCEDURE:  The patient was placed in a supine position and given a general endotracheal anesthetic.  He was given 1 g of Ancef at the initiation of the procedure.  The patient already had a small umbilical hernia, which we decided not to _____ at the time of surgery.  I made an infraumbilical incision with sharp dissection carried down to the abdominal cavity.  A 0 degree, 10 mm laparoscope was  inserted through a 12 mm Hasson trocar.  This Hasson trocar was secured with a 0 Vicryl suture.  As laparoscopic exploration was carried out, the right and left lobe of the liver were somewhat fatty but otherwise had no distinct mass or nodularity.  The stomach overall was unremarkable.  The patient had a prior abdominal wall hernia repair, and I could see a Marlex mesh in the right lower quadrant was partly uncovered but partly covered by both bowel and omentum.  I then placed three additional trocars, a 10 mm Ethicon trocar in the subxiphoid location, a 5 mm Ethicon trocar in the right midsubcostal location, and a 5 mm Ethicon trocar in the right lateral subcostal location.  Again I avoided any bowel or any of the adhesions which they were stuck up.  I was able to wall the gallbladder off, but the gallbladder was just significantly thickened, hard.  You really could never adequately grasp it.  It had a thick wall to it consistent with disease which was much more chronic than what the patient gave a history for.  I dissected out and identified an area that looked like the cystic duct.  I put a clip to the gallbladder side and then shot an intraoperative cholangiogram.  Actually I was probably cutting into the very distal portion of the gallbladder.  I used a Public librarian.  I inserted this through a 14 gauge Jelco into the gallbladder, secured it with an endoclip, and blew the balloon up.  The intraoperative cholangiogram showed free flow of contrast down the very distal segment of the gallbladder into the cystic  duct, into the common bile duct, and into the duodenum.  There was no filling defect, and the bile refluxed freely up into the hepatic radicles; but he had a lot of chronic inflammation, and I was very hesitant to do any further dissection.  I then removed the Reddick catheter, divided the gallbladder at that site.  I then put three 3-0 chromic endoloops around the  distal segment, which I think occluded the cystic duct.  There was no bile leak back after this procedure.  I then took out the gallbladder sharply and bluntly.  I cut across the cystic artery, which was really dense scar tissue.  I then replaced two clips across this, which secured the bleeding.  I then removed the remainder of the gallbladder with hook Bovie coagulation.  I did leave a small segment of the gallbladder on the liver, which may be 1-1.5 cm in size.  I put the gallbladder into an Endocatch bag.  I delivered it through the umbilicus.  I then went back and cauterized the remaining gallbladder fragment.  I examined the triangle of Calot, saw no bleeding or bile leak.  I looked at the gallbladder bed.  There was no bleeding or bile leak.  I then irrigated the abdomen with about 2 L of saline.  I visualized each trocar site as it was removed.  There was no bleeding at any trocar site.  I closed the umbilical trocar with a 0 Vicryl suture, which was there.  I closed the skin at each site with a 5-0 Vicryl, painted the wounds with tincture of benzoin and steri-stripped them.  The patient tolerated the procedure well, was transported to the recovery room in good condition.  Sponge and needle count were correct. Dictated by:   Sandria Bales. Ezzard Standing, M.D. Attending Physician:  Andre Lefort DD:  02/02/02 TD:  02/02/02 Job: [id number]

## 2011-05-11 NOTE — Discharge Summary (Signed)
[place]  Patient:    Nathan Willis, Nathan Willis                      MRN: [id number] Adm. Date:  [id number] Disc. Date: [id number] Attending:  Waldo Laine Dictator:   Loura Pardon, P.A.-C. CC:         Darci Needle, M.D.  Chales Salmon. Abigail Miyamoto, M.D.   Discharge Summary  DATE OF BIRTH:  Oct 30, 1922  CARDIOLOGIST:  Darci Needle, M.D.  DISCHARGE DIAGNOSES: 1. Fracture of sternal wires/sternal nonunion. 2. Status post coronary artery bypass graft surgery in 1997. 3. Ventral hernia discovered during repair of sternal wire fracture.  SECONDARY DIAGNOSES: 1. History of atherosclerotic coronary artery disease, status post coronary    artery bypass graft surgery in 1997. 2. Remote history of ethanol use. 3. Hypercholesterolemia. 4. Arthritis. 5. Benign prostatic hypertrophy. 6. Status post appendectomy secondary to peritonitis in 1987. 7. Transurethral resection of the prostate 1993. 8. Status post left heart catheterization in 1997, followed up by coronary    artery bypass graft surgery.  PROCEDURE:  February 25, 2001, sternal rewiring/mesh repair of ventral hernia, Dr. Sheliah Plane.  The patient tolerated the procedure well, was extubated in the operating room, and was transferred satisfactorily to the intensive care unit.  DISCHARGE DISPOSITION:  Nathan Willis is ready for discharge March 7, postoperative day #2.  His pain is well controlled.  He uses oral analgesia for this, Percocet.  The sternum is free of the movement and clicking that was present in the preoperative state.  The incision is healing nicely.  There was no evidence of erythema or drainage.  He was relieved of oral supplemental oxygen by postoperative day #1, achieving a 95% oxygen saturation on room air. Postoperative day #2, he was ambulating independently.  His mental status has remained clear in the postoperative period.  He is afebrile.  DISCHARGE  MEDICATIONS: 1. Percocet 5/325, 1-2 tablets p.o. q.4-6h. p.r.n. pain. 2. Ecotrin 81 mg daily. 3. Nystatin 50 mg daily.  DISCHARGE ACTIVITY:  Ambulation as tolerated.  He is asked not to lift more than 10 pounds for six weeks.  He is asked not to drive for six weeks.  DISCHARGE DIET.  Low sodium, low cholesterol diet.  WOUND CARE:  He may shower daily beginning Saturday, March 9.  He is asked to sponge bathe until then.  FOLLOW-UP:  He has an office visit with Dr. Tyrone Sage, Thursday, March 14.  Dr. Ysidro Evert office will call him to arrange the time for that visit.  BRIEF HISTORY:  Nathan Willis is a 77 year old male with a history of atherosclerotic coronary artery disease.  He underwent coronary artery bypass graft surgery in 1997, and was previously seen in 1998 with complaint of clicking at the lower portion of the sternal incision.  Around December 2001, the patient noted flu-like symptoms and increased coughing and had significant discomfort in the lower sternum.  He was evaluated by Dr. Tyrone Sage.  Chest x-ray showed fracture of lower sternal wires.  Dr. Tyrone Sage has recommended proceeding with sternal rewiring schedule for February 25, 2001.  The patient complains of the clicking that he hears in the sternum is worse when lying on his back.  The patient denies any paroxysmal nocturnal dyspnea, no persistent cough, no productive sputum, no anginal symptoms.  He is not experiencing any symptoms of TIA or CVA.  HOSPITAL COURSE:  Is as described in discharge  disposition.  His hospital course has been relatively unremarkable and has lasted two days only.  He has not required prolonged oxygen supplementation.  His mental status has been clear.  He has been afebrile postoperatively.  His pain is well controlled. He is ambulating independently.  He is taking oral nourishment well and tolerating it.  His incisions look a discharge.  He is stable and improved with follow-up with Dr.  Tyrone Sage, March 06, 2001. DD:  02/27/01 TD:  02/27/01 Job: [id number]

## 2011-05-11 NOTE — H&P (Signed)
[place]  Patient:    Nathan Willis, Nathan Willis                     MRN: [id number] Adm. Date:  02/25/01 Attending:  Gwenith Daily. Tyrone Sage, M.D. Dictator:   Durenda Age, P.A.-C. CC:         Chales Salmon. Abigail Miyamoto, M.D.  Celso Sickle, M.D.   History and Physical  CHIEF COMPLAINT:  Sternal instability/separation.  HISTORY OF PRESENT ILLNESS:  A 77 year old white male with a history of CAD, status post CABG in 1997, previously seen in 1988 with complaints of "clicking" at the lower portion of his sternal incision.  Around December 2001 the patient noted having flu-like symptoms and increased coughing, causing significant discomfort in the lower sternal incision.  He was evaluated by Dr. Tyrone Sage.  A chest x-ray was showing fracture of the lower sternal wires. Because this area is interfering with his activities of daily living, Dr. Tyrone Sage recommended to proceed with sternal re-wiring, which is scheduled for February 25, 2001.  Other than mild shortness of breath, dyspnea on exertion, and a "clicking worse when lying on the back" the patient denies any paroxysmal nocturnal dyspnea, cough, sputum production, anginal symptoms, nausea, vomiting, diaphoresis, fever, chills; symptoms of TIA, CVA or remote ______ ______ .  PAST MEDICAL HISTORY: 1. CAD, status post CABG. 2. Remote history of alcohol abuse. 3. Hypercholesterolemia. 4. Arthritis. 5. History of BPH. 6. Remote history of pneumonia.  SURGERIES: 1. Status post cardiac catheterization in 1997 -- Dr. Katrinka Blazing. 2. Status post CABG in 1997 -- Dr. Tyrone Sage. 3. Status post appendectomy, secondary to peritonitis in 1987 x 3. 4. Status post TURP in 1993.  MEDICATIONS: 1. Ecotrin 81 mg q.d. 2. Niaspan 50 mg q.d.  ALLERGIES:  MORPHINE (agitation, mental status changes).  REVIEW OF SYSTEMS:  See HPI and past medical history for significant positives.  No diabetes, kidney disease, asthma or CHF.  He  complains of "feet numb for years", with unknown etiology.  FAMILY HISTORY:  Mother died at 55, she was murdered.  Father died at 64 of stroke.  Grandfather and grandmother died of MI.  SOCIAL HISTORY:  Married.  One grown child.  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  He quit 20 years ago the use of tobacco.  He denies any alcohol intake at this point.  PHYSICAL EXAMINATION:  GENERAL:  Well-developed, well-nourished 77 year old white male; in no acute distress, alert and oriented x 3.  VITAL SIGNS:  Blood pressure 140/70, pulse 64, respirations 16.  HEENT:  Normocephalic, atraumatic.  PERLA.  EOMI.  Mild cataracts.  No glaucoma or macular degeneration.  NECK:  Supple.  No JVD, bruits or lymphadenopathy.  CHEST:  Symmetrical inspiration.  He has 2.5 to 3 cm spatial defect in the inferior pole of the sternum, with an easily reducible hernia.  There is an audible click.  Lungs show no wheezes, rales or rhonchi.  No lymphadenopathy.  CARDIOVASCULAR:  Regular rate and rhythm.  No murmurs, rubs or gallops.  ABDOMEN:  Soft, nontender.  Bowel sounds x 4.  No masses or bruits.  GU/RECTAL:  Deferred.  EXTREMITIES:  No clubbing, cyanosis; 1+ edema bilaterally.  No ulcerations. Temperature warm.  Peripheral pulses and carotid 2+ bilaterally.  Femoral, popliteal, dorsalis pedis and posterior tibialis 2+ bilaterally.  NEUROLOGIC:  Nonfocal.  Gait steady.  DTRs 2+ bilaterally.  Muscle strength 5/5.  ASSESSMENT AND PLAN:  The patient will undergo sternal wiring on February 25, 2001 at [facility] by Dr. Tyrone Sage.  Dr. Tyrone Sage has seen and evaluated the patient prior to the admission, and has explained the risks and benefits involving the procedure.  The patient has agreed to continue. DD:  02/21/01 TD:  02/21/01 Job: [id number]

## 2011-09-25 LAB — POCT CARDIAC MARKERS: Myoglobin, poc: 113

## 2011-09-25 LAB — PROTIME-INR
INR: 1.1
Prothrombin Time: 14.3

## 2011-09-25 LAB — BASIC METABOLIC PANEL
Calcium: 8.9
Chloride: 104
GFR calc non Af Amer: >60
Potassium: 3.8
Sodium: 137

## 2011-09-25 LAB — CBC
HCT: 45.4
MCHC: 33.4
RBC: 4.69
WBC: 9.3

## 2011-09-25 LAB — TROPONIN I: Troponin I: 0.01

## 2011-09-25 LAB — POCT I-STAT, CHEM 8
HCT: 46
Hemoglobin: 15.6
Sodium: 141
TCO2: 25

## 2011-09-25 LAB — APTT: aPTT: 28

## 2011-09-27 LAB — CBC
HCT: 45.9 % (ref 39.0–52.0)
MCHC: 32.9 g/dL (ref 30.0–36.0)
MCV: 96.1 fL (ref 78.0–100.0)
MCV: 96.5 fL (ref 78.0–100.0)
Platelets: 258 10*3/uL (ref 150–400)
RBC: 5.02 MIL/uL (ref 4.22–5.81)
RDW: 13.6 % (ref 11.5–15.5)
RDW: 14 % (ref 11.5–15.5)

## 2011-09-27 LAB — COMPREHENSIVE METABOLIC PANEL
ALT: 19 U/L (ref 0–53)
Alkaline Phosphatase: 60 U/L (ref 39–117)
CO2: 27 mEq/L (ref 19–32)
Calcium: 8.9 mg/dL (ref 8.4–10.5)
GFR calc non Af Amer: >60 mL/min (ref >60)
Glucose, Bld: 142 mg/dL — ABNORMAL HIGH (ref 70–99)
Sodium: 137 mEq/L (ref 135–145)

## 2011-09-27 LAB — CARDIAC PANEL(CRET KIN+CKTOT+MB+TROPI)
Relative Index: INVALID (ref 0.0–2.5)
Troponin I: 0.02 ng/mL (ref 0.00–0.06)

## 2011-09-27 LAB — CK TOTAL AND CKMB (NOT AT ARMC): Relative Index: INVALID (ref 0.0–2.5)

## 2011-09-27 LAB — TROPONIN I: Troponin I: 0.01 ng/mL (ref 0.00–0.06)

## 2011-09-27 LAB — PROTIME-INR: Prothrombin Time: 13.5 seconds (ref 11.6–15.2)

## 2013-05-12 ENCOUNTER — Other Ambulatory Visit: Payer: Self-pay | Admitting: Family Medicine

## 2013-05-29 ENCOUNTER — Inpatient Hospital Stay (HOSPITAL_COMMUNITY)
Admission: EM | Admit: 2013-05-29 | Discharge: 2013-06-03 | DRG: 281 | Disposition: A | Discharge status: Skilled Nursing Facility | Payer: Medicare Other | Attending: Family Medicine | Admitting: Family Medicine

## 2013-05-29 ENCOUNTER — Encounter (HOSPITAL_COMMUNITY): Payer: Self-pay | Admitting: Emergency Medicine

## 2013-05-29 ENCOUNTER — Emergency Department (HOSPITAL_COMMUNITY): Payer: Medicare Other

## 2013-05-29 DIAGNOSIS — L03114 Cellulitis of left upper limb: Secondary | ICD-10-CM

## 2013-05-29 DIAGNOSIS — Z951 Presence of aortocoronary bypass graft: Secondary | ICD-10-CM

## 2013-05-29 DIAGNOSIS — Z66 Do not resuscitate: Secondary | ICD-10-CM | POA: Diagnosis present

## 2013-05-29 DIAGNOSIS — M6282 Rhabdomyolysis: Secondary | ICD-10-CM

## 2013-05-29 DIAGNOSIS — I5042 Chronic combined systolic (congestive) and diastolic (congestive) heart failure: Secondary | ICD-10-CM

## 2013-05-29 DIAGNOSIS — Z9089 Acquired absence of other organs: Secondary | ICD-10-CM

## 2013-05-29 DIAGNOSIS — J90 Pleural effusion, not elsewhere classified: Secondary | ICD-10-CM | POA: Diagnosis present

## 2013-05-29 DIAGNOSIS — W010XXA Fall on same level from slipping, tripping and stumbling without subsequent striking against object, initial encounter: Secondary | ICD-10-CM | POA: Diagnosis present

## 2013-05-29 DIAGNOSIS — G609 Hereditary and idiopathic neuropathy, unspecified: Secondary | ICD-10-CM

## 2013-05-29 DIAGNOSIS — R7989 Other specified abnormal findings of blood chemistry: Secondary | ICD-10-CM

## 2013-05-29 DIAGNOSIS — Z87891 Personal history of nicotine dependence: Secondary | ICD-10-CM

## 2013-05-29 DIAGNOSIS — I639 Cerebral infarction, unspecified: Secondary | ICD-10-CM

## 2013-05-29 DIAGNOSIS — Z8673 Personal history of transient ischemic attack (TIA), and cerebral infarction without residual deficits: Secondary | ICD-10-CM

## 2013-05-29 DIAGNOSIS — E785 Hyperlipidemia, unspecified: Secondary | ICD-10-CM | POA: Diagnosis present

## 2013-05-29 DIAGNOSIS — I10 Essential (primary) hypertension: Secondary | ICD-10-CM

## 2013-05-29 DIAGNOSIS — N400 Enlarged prostate without lower urinary tract symptoms: Secondary | ICD-10-CM

## 2013-05-29 DIAGNOSIS — Y92009 Unspecified place in unspecified non-institutional (private) residence as the place of occurrence of the external cause: Secondary | ICD-10-CM

## 2013-05-29 DIAGNOSIS — R7309 Other abnormal glucose: Secondary | ICD-10-CM | POA: Diagnosis present

## 2013-05-29 DIAGNOSIS — I129 Hypertensive chronic kidney disease with stage 1 through stage 4 chronic kidney disease, or unspecified chronic kidney disease: Secondary | ICD-10-CM | POA: Diagnosis present

## 2013-05-29 DIAGNOSIS — R4181 Age-related cognitive decline: Secondary | ICD-10-CM | POA: Diagnosis present

## 2013-05-29 DIAGNOSIS — Z888 Allergy status to other drugs, medicaments and biological substances status: Secondary | ICD-10-CM

## 2013-05-29 DIAGNOSIS — I214 Non-ST elevation (NSTEMI) myocardial infarction: Principal | ICD-10-CM

## 2013-05-29 DIAGNOSIS — Z7982 Long term (current) use of aspirin: Secondary | ICD-10-CM

## 2013-05-29 DIAGNOSIS — F0390 Unspecified dementia without behavioral disturbance: Secondary | ICD-10-CM | POA: Diagnosis present

## 2013-05-29 DIAGNOSIS — N183 Chronic kidney disease, stage 3 unspecified: Secondary | ICD-10-CM

## 2013-05-29 DIAGNOSIS — R5381 Other malaise: Secondary | ICD-10-CM | POA: Diagnosis present

## 2013-05-29 DIAGNOSIS — N179 Acute kidney failure, unspecified: Secondary | ICD-10-CM

## 2013-05-29 DIAGNOSIS — W19XXXA Unspecified fall, initial encounter: Secondary | ICD-10-CM

## 2013-05-29 DIAGNOSIS — Z7902 Long term (current) use of antithrombotics/antiplatelets: Secondary | ICD-10-CM

## 2013-05-29 DIAGNOSIS — S41109A Unspecified open wound of unspecified upper arm, initial encounter: Secondary | ICD-10-CM | POA: Diagnosis present

## 2013-05-29 DIAGNOSIS — I2510 Atherosclerotic heart disease of native coronary artery without angina pectoris: Secondary | ICD-10-CM

## 2013-05-29 DIAGNOSIS — I509 Heart failure, unspecified: Secondary | ICD-10-CM | POA: Diagnosis present

## 2013-05-29 DIAGNOSIS — Z79899 Other long term (current) drug therapy: Secondary | ICD-10-CM

## 2013-05-29 DIAGNOSIS — IMO0002 Reserved for concepts with insufficient information to code with codable children: Secondary | ICD-10-CM | POA: Diagnosis present

## 2013-05-29 HISTORY — DX: Essential (primary) hypertension: I10

## 2013-05-29 HISTORY — DX: Hyperlipidemia, unspecified: E78.5

## 2013-05-29 HISTORY — DX: Hereditary and idiopathic neuropathy, unspecified: G60.9

## 2013-05-29 HISTORY — DX: Pneumonia, unspecified organism: J18.9

## 2013-05-29 HISTORY — DX: Atherosclerotic heart disease of native coronary artery without angina pectoris: I25.10

## 2013-05-29 HISTORY — DX: Bursopathy, unspecified: M71.9

## 2013-05-29 HISTORY — DX: Unspecified malignant neoplasm of skin, unspecified: C44.90

## 2013-05-29 HISTORY — DX: Cerebral infarction, unspecified: I63.9

## 2013-05-29 HISTORY — DX: Polyneuropathy, unspecified: G62.9

## 2013-05-29 HISTORY — DX: Benign prostatic hyperplasia without lower urinary tract symptoms: N40.0

## 2013-05-29 HISTORY — DX: Other forms of angina pectoris: I20.89

## 2013-05-29 HISTORY — DX: Other forms of angina pectoris: I20.8

## 2013-05-29 LAB — PROTIME-INR: INR: 1.27 (ref 0.00–1.49)

## 2013-05-29 LAB — COMPREHENSIVE METABOLIC PANEL
AST: 65 U/L — ABNORMAL HIGH (ref 0–37)
CO2: 21 mEq/L (ref 19–32)
Calcium: 9.1 mg/dL (ref 8.4–10.5)
Creatinine, Ser: 1.45 mg/dL — ABNORMAL HIGH (ref 0.50–1.35)
GFR calc non Af Amer: 41 mL/min — ABNORMAL LOW (ref >90)
Sodium: 141 mEq/L (ref 135–145)
Total Protein: 6.5 g/dL (ref 6.0–8.3)

## 2013-05-29 LAB — CK: Total CK: 3115 U/L — ABNORMAL HIGH (ref 7–232)

## 2013-05-29 LAB — TROPONIN I: Troponin I: 2.47 ng/mL (ref <0.30)

## 2013-05-29 LAB — CBC WITH DIFFERENTIAL/PLATELET
Basophils Absolute: 0 10*3/uL (ref 0.0–0.1)
Basophils Relative: 0 % (ref 0–1)
Eosinophils Absolute: 0 10*3/uL (ref 0.0–0.7)
Eosinophils Relative: 0 % (ref 0–5)
HCT: 41.8 % (ref 39.0–52.0)
Lymphocytes Relative: 4 % — ABNORMAL LOW (ref 12–46)
MCH: 30.9 pg (ref 26.0–34.0)
MCHC: 33.7 g/dL (ref 30.0–36.0)
MCV: 91.5 fL (ref 78.0–100.0)
Monocytes Absolute: 0.5 10*3/uL (ref 0.1–1.0)
Platelets: 283 10*3/uL (ref 150–400)
RDW: 14 % (ref 11.5–15.5)

## 2013-05-29 MED ORDER — ISOSORBIDE MONONITRATE ER 60 MG PO TB24
120.0000 mg | ORAL_TABLET | Freq: Every day | ORAL | Status: DC
  Administered 2013-05-29 – 2013-06-03 (×6): 120 mg via ORAL
  Filled 2013-05-29 (×6): qty 2

## 2013-05-29 MED ORDER — DOCUSATE SODIUM 100 MG PO CAPS
100.0000 mg | ORAL_CAPSULE | Freq: Two times a day (BID) | ORAL | Status: DC
  Administered 2013-05-29 – 2013-06-03 (×10): 100 mg via ORAL
  Filled 2013-05-29 (×11): qty 1

## 2013-05-29 MED ORDER — ENOXAPARIN SODIUM 40 MG/0.4ML ~~LOC~~ SOLN
40.0000 mg | SUBCUTANEOUS | Status: DC
  Administered 2013-05-29 – 2013-06-02 (×5): 40 mg via SUBCUTANEOUS
  Filled 2013-05-29 (×6): qty 0.4

## 2013-05-29 MED ORDER — ACETAMINOPHEN 650 MG RE SUPP: 650.0000 mg | Freq: Four times a day (QID) | RECTAL | Status: DC | PRN

## 2013-05-29 MED ORDER — FINASTERIDE 5 MG PO TABS
5.0000 mg | ORAL_TABLET | Freq: Every day | ORAL | Status: DC
  Administered 2013-05-29 – 2013-06-03 (×6): 5 mg via ORAL
  Filled 2013-05-29 (×6): qty 1

## 2013-05-29 MED ORDER — SODIUM CHLORIDE 0.9 % IV BOLUS (SEPSIS)
500.0000 mL | Freq: Once | INTRAVENOUS | Status: AC
  Administered 2013-05-29: 500 mL via INTRAVENOUS

## 2013-05-29 MED ORDER — SODIUM CHLORIDE 0.9 % IV SOLN
INTRAVENOUS | Status: AC
  Administered 2013-05-29: 18:00:00 via INTRAVENOUS

## 2013-05-29 MED ORDER — ONDANSETRON HCL 4 MG/2ML IJ SOLN: 4.0000 mg | Freq: Four times a day (QID) | INTRAMUSCULAR | Status: DC | PRN

## 2013-05-29 MED ORDER — ALBUTEROL SULFATE (5 MG/ML) 0.5% IN NEBU: 2.5000 mg | INHALATION_SOLUTION | RESPIRATORY_TRACT | Status: DC | PRN

## 2013-05-29 MED ORDER — CLOPIDOGREL BISULFATE 75 MG PO TABS
75.0000 mg | ORAL_TABLET | Freq: Every day | ORAL | Status: DC
  Administered 2013-05-29 – 2013-06-03 (×6): 75 mg via ORAL
  Filled 2013-05-29 (×5): qty 1

## 2013-05-29 MED ORDER — ASPIRIN EC 325 MG PO TBEC
325.0000 mg | DELAYED_RELEASE_TABLET | Freq: Every day | ORAL | Status: DC
  Administered 2013-05-30 – 2013-06-03 (×6): 325 mg via ORAL
  Filled 2013-05-29 (×5): qty 1

## 2013-05-29 MED ORDER — ACETAMINOPHEN 325 MG PO TABS: 650.0000 mg | ORAL_TABLET | Freq: Four times a day (QID) | ORAL | Status: DC | PRN

## 2013-05-29 MED ORDER — METOPROLOL SUCCINATE ER 25 MG PO TB24
25.0000 mg | ORAL_TABLET | Freq: Every day | ORAL | Status: DC
  Administered 2013-05-29 – 2013-06-03 (×6): 25 mg via ORAL
  Filled 2013-05-29 (×6): qty 1

## 2013-05-29 MED ORDER — ONDANSETRON HCL 4 MG PO TABS: 4.0000 mg | ORAL_TABLET | Freq: Four times a day (QID) | ORAL | Status: DC | PRN

## 2013-05-29 MED ORDER — SODIUM CHLORIDE 0.9 % IJ SOLN
3.0000 mL | Freq: Two times a day (BID) | INTRAMUSCULAR | Status: DC
  Administered 2013-05-31 – 2013-06-03 (×5): 3 mL via INTRAVENOUS

## 2013-05-29 NOTE — ED Provider Notes (Signed)
History     CSN: [id number]  Arrival date & time 05/29/13  1325   First MD Initiated Contact with Patient 05/29/13 1327      Chief Complaint  Patient presents with  . Fall    (Consider location/radiation/quality/duration/timing/severity/associated sxs/prior treatment) HPI Pt found in bathroom by niece. PT states he and his wife were in the bathroom last night and tripped with both of them falling to the ground. Pt denies LOC. Was unable to get up off floor. Denies dizziness, CP, SOB, abd pain, N/V/D.  Past Medical History  Diagnosis Date  . Coronary artery disease   . Hypertension   . Angina at rest   . Hyperlipidemia     Past Surgical History  Procedure Laterality Date  . Open heart surgery      No family history on file.  History  Substance Use Topics  . Smoking status: Never Smoker   . Smokeless tobacco: Not on file  . Alcohol Use: No      Review of Systems  Constitutional: Negative for fever and chills.  HENT: Negative for neck pain.   Respiratory: Negative for shortness of breath.   Cardiovascular: Negative for chest pain.  Gastrointestinal: Negative for nausea, vomiting and abdominal pain.  Genitourinary: Negative for dysuria.  Musculoskeletal: Negative for back pain.  Skin: Negative for rash.  Neurological: Negative for dizziness, weakness, light-headedness, numbness and headaches.  All other systems reviewed and are negative.    Allergies  Baycol; Codeine; Lescol; Lipitor; Morphine and related; Zetia; and Zocor  Home Medications   Current Outpatient Rx  Name  Route  Sig  Dispense  Refill  . cephALEXin (KEFLEX) 500 MG capsule   Oral   Take 500 mg by mouth 3 (three) times daily. Unknown reason rx date 05/20/13.         . clopidogrel (PLAVIX) 75 MG tablet   Oral   Take 75 mg by mouth daily.         . finasteride (PROSCAR) 5 MG tablet   Oral   Take 5 mg by mouth daily.         . furosemide (LASIX) 20 MG tablet   Oral   Take 20 mg by  mouth 2 (two) times daily.         . isosorbide mononitrate (IMDUR) 120 MG 24 hr tablet   Oral   Take 120 mg by mouth daily.         . metoprolol succinate (TOPROL-XL) 25 MG 24 hr tablet   Oral   Take 25 mg by mouth daily.         . nitroGLYCERIN (NITROSTAT) 0.4 MG SL tablet   Sublingual   Place 0.4 mg under the tongue every 5 (five) minutes as needed for chest pain.         . pravastatin (PRAVACHOL) 20 MG tablet   Oral   Take 20 mg by mouth daily.           BP 147/82  Pulse 102  Temp(Src) 97.6 F (36.4 C) (Oral)  Resp 28  SpO2 94%  Physical Exam  Nursing note and vitals reviewed. Constitutional: He is oriented to person, place, and time. He appears well-developed and well-nourished. No distress.  HENT:  Head: Normocephalic and atraumatic.  Mouth/Throat: Oropharynx is clear and moist.  Eyes: EOM are normal. Pupils are equal, round, and reactive to light.  Neck:  c-collar in place  Cardiovascular: Normal rate and regular rhythm.   Pulmonary/Chest: Effort normal  and breath sounds normal. No respiratory distress. He has no wheezes. He has no rales.  Abdominal: Soft. Bowel sounds are normal.  Musculoskeletal: Normal range of motion. He exhibits no edema.  No midline T/L tenderness  Neurological: He is alert and oriented to person, place, and time.  4/5 strength in all ext, sensation grossly intact  Skin: Skin is warm and dry. No rash noted. No erythema.  Ecchymosis to R buttock and R calf  Psychiatric: He has a normal mood and affect. His behavior is normal.    ED Course  Procedures (including critical care time)  Labs Reviewed  CBC WITH DIFFERENTIAL - Abnormal; Notable for the following:    WBC 13.6 (*)    Neutrophils Relative % 92 (*)    Neutro Abs 12.6 (*)    Lymphocytes Relative 4 (*)    Lymphs Abs 0.5 (*)    All other components within normal limits  COMPREHENSIVE METABOLIC PANEL - Abnormal; Notable for the following:    Glucose, Bld 218 (*)     BUN 25 (*)    Creatinine, Ser 1.45 (*)    Albumin 3.4 (*)    AST 65 (*)    GFR calc non Af Amer 41 (*)    GFR calc Af Amer 48 (*)    All other components within normal limits  PROTIME-INR - Abnormal; Notable for the following:    Prothrombin Time 15.6 (*)    All other components within normal limits  TROPONIN I - Abnormal; Notable for the following:    Troponin I 2.47 (*)    All other components within normal limits  CK - Abnormal; Notable for the following:    Total CK 3115 (*)    All other components within normal limits  APTT  URINALYSIS, ROUTINE W REFLEX MICROSCOPIC   Dg Chest Port 1 View  05/29/2013   *RADIOLOGY REPORT*  Clinical Data: Chest pain  PORTABLE CHEST - 1 VIEW  Comparison: 12/22/2012  Findings: Low volume rotated film. The cardiopericardial silhouette is enlarged.  There is collapse / consolidation at the left base with a small left pleural effusion.  The patient is status post median sternotomy with multiple fractured mediastinal wires. Telemetry leads overlie the chest.  IMPRESSION: Cardiomegaly with left base collapse / consolidation and small left pleural effusion.   Original Report Authenticated By: Kennith Center, M.D.     1. Fall, initial encounter   2. Rhabdomyolysis   3. Elevated troponin      Date: 05/29/2013  Rate: 104  Rhythm: sinus tachycardia  QRS Axis: normal  Intervals: normal  ST/T Wave abnormalities: nonspecific T wave changes  Conduction Disutrbances:none  Narrative Interpretation:   Old EKG Reviewed: changes noted    MDM   Discussed with Dr Anne Fu. Will see pt in ED. Will discuss with [facility] to admit pt.   Dr Lavera Guise will admit.      Loren Racer, MD 05/29/13 [phone]

## 2013-05-29 NOTE — H&P (Signed)
[facility] History and Physical  Nathan Willis ZOX:[id number] DOB: 1922-02-02 DOA: 05/29/2013  Referring physician: ED PCP: [name], MD  Specialists: [facility]   Chief Complaint:  Chief Complaint  Patient presents with  . Fall     HPI: Nathan Willis is a 79 y.o. male past medical history of coronary artery disease, cognitive deficits, polyneuropathy, was brought to the emergency room after he was found down in his bathroom. He probably fell the night before admission and he was on the floor all night. His niece found him today and had to break the door to get to him. Patient has no exact recollection of the events that led to his fall - but he thinks that maybe his wife tripped him.  He's not oriented to date, month, situation. Evaluation in emergency room revealed an elevated troponin level with EKG changes but the patient denies current chest pain . Further evaluation indicates also renal insufficiency and rhabdomyolysis . Patient complains of some left-sided pain.    Review of Systems:  No other symptoms are reported beyond the history of present illness. The review of system is severely limited by the patient's cognitive deficits Niece reports that patient is ambulating with a shuffled gait      Past Medical History  Diagnosis Date  . Coronary artery disease   . Hypertension   . Angina at rest   . Hyperlipidemia   . CVA (cerebral infarction)   . BPH (benign prostatic hyperplasia)   . Idiopathic polyneuropathy    Past Surgical History  Procedure Laterality Date  . Coronary artery bypass graft    . Hernia repair    . Appendectomy     Social History:  reports that he has never smoked. He does not have any smokeless tobacco history on file. He reports that he does not drink alcohol or use illicit drugs. he lives with his wife who has dementia according to the niece   the patient still drives although the niece says he shouldn't  Allergies   Allergen Reactions  . Baycol (Cerivastatin) Other (See Comments)    REACTION:  unknown  . Codeine Other (See Comments)    REACTION:  "makes me crazy"  . Lescol (Fluvastatin Sodium) Other (See Comments)    REACTION:  unknown  . Lipitor (Atorvastatin) Other (See Comments)    REACTION:  "makes me violent"  . Morphine And Related Other (See Comments)    REACTION:  "makes me crazy"  . Zetia (Ezetimibe) Other (See Comments)    REACTION:  unknown  . Zocor (Simvastatin) Other (See Comments)    REACTION:  unknown    Family history is unobtainable due to the patient's cognitive deficits and advanced age  Prior to Admission medications   Medication Sig Start Date End Date Taking? Authorizing Provider  cephALEXin (KEFLEX) 500 MG capsule Take 500 mg by mouth 3 (three) times daily. Unknown reason rx date 05/20/13.   Yes Historical Provider, MD  clopidogrel (PLAVIX) 75 MG tablet Take 75 mg by mouth daily.   Yes Historical Provider, MD  finasteride (PROSCAR) 5 MG tablet Take 5 mg by mouth daily.   Yes Historical Provider, MD  furosemide (LASIX) 20 MG tablet Take 20 mg by mouth 2 (two) times daily.   Yes Historical Provider, MD  isosorbide mononitrate (IMDUR) 120 MG 24 hr tablet Take 120 mg by mouth daily.   Yes Historical Provider, MD  metoprolol succinate (TOPROL-XL) 25 MG 24 hr tablet Take 25 mg by  mouth daily.   Yes Historical Provider, MD  nitroGLYCERIN (NITROSTAT) 0.4 MG SL tablet Place 0.4 mg under the tongue every 5 (five) minutes as needed for chest pain.   Yes Historical Provider, MD  pravastatin (PRAVACHOL) 20 MG tablet Take 20 mg by mouth daily.   Yes Historical Provider, MD   Physical Exam: Filed Vitals:   05/29/13 1327  BP: 147/82  Pulse: 102  Temp: 97.6 F (36.4 C)  TempSrc: Oral  Resp: 28  SpO2: 94%     General:   alert, not oriented to place or time, he recognizes his niece  Eyes:  pupil of 3 mm symmetric and reactive to light, extraocular movement is intact  ENT:  dry  oral mucosa  Neck:  no jugular venous distention  Cardiovascular:  regular rate and rhythm without murmurs rubs or gallops, +2 bilateral pedal edema  Respiratory:  clear to auscultation bilaterally  Abdomen:  soft, nontender, palpable induration in the right lower quadrant of unclear etiology  Skin:  multiple bruises  Musculoskeletal:  tender over the left wrist and left shoulder  Psychiatric:  unobtainable  Neurologic:  rigidity, loss of sensation in lower extremitiess bilaterally, strength 4/5 in all limbs symmetric, speech intact, repetition intact  Labs on Admission:  Basic Metabolic Panel:  Recent Labs Lab 05/29/13 1350  NA 141  K 4.2  CL 105  CO2 21  GLUCOSE 218*  BUN 25*  CREATININE 1.45*  CALCIUM 9.1   Liver Function Tests:  Recent Labs Lab 05/29/13 1350  AST 65*  ALT 28  ALKPHOS 73  BILITOT 1.0  PROT 6.5  ALBUMIN 3.4*   No results found for this basename: LIPASE, AMYLASE,  in the last 168 hours No results found for this basename: AMMONIA,  in the last 168 hours CBC:  Recent Labs Lab 05/29/13 1350  WBC 13.6*  NEUTROABS 12.6*  HGB 14.1  HCT 41.8  MCV 91.5  PLT 283   Cardiac Enzymes:  Recent Labs Lab 05/29/13 1350 05/29/13 1351  CKTOTAL 3115*  --   TROPONINI  --  2.47*    BNP (last 3 results) No results found for this basename: PROBNP,  in the last 8760 hours CBG: No results found for this basename: GLUCAP,  in the last 168 hours  Radiological Exams on Admission: Ct Head Wo Contrast  05/29/2013   *RADIOLOGY REPORT*  Clinical Data:  Found down  CT HEAD WITHOUT CONTRAST CT CERVICAL SPINE WITHOUT CONTRAST  Technique:  Multidetector CT imaging of the head and cervical spine was performed following the standard protocol without intravenous contrast.  Multiplanar CT image reconstructions of the cervical spine were also generated.  Comparison:  Head CT from 02/28/2011  CT HEAD  Findings: There is no evidence for acute hemorrhage,  hydrocephalus, mass lesion, or abnormal extra-axial fluid collection.  No definite CT evidence for acute infarction.  Diffuse loss of parenchymal volume is consistent with atrophy.  Focal nonacute infarct in the posterior left frontal lobe is new in the interval since the prior study. Patchy low attenuation in the deep hemispheric and periventricular white matter is nonspecific, but likely reflects chronic microvascular ischemic demyelination.  The visualized paranasal sinuses and mastoid air cells are clear.  IMPRESSION: No acute intracranial abnormality.  Atrophy with chronic small vessel white matter ischemic demyelination.  Nonacute infarct involving the posterior left frontal lobe.  CT CERVICAL SPINE  Findings: Imaging was obtained from the skull base to the C7-T1 interspace.  No evidence for fracture.  There  is loss of intervertebral disc space from C3-4 down to C6-7 with associated endplate degeneration.  Diffuse facet osteoarthritis is also noted bilaterally, but most prominent at CT 3N C3-4 on the right.  No evidence for prevertebral soft tissue swelling.  Partial imaging of the left apex reveals a pleural effusion.  IMPRESSION: Diffuse degenerative changes in the cervical spine without evidence for an acute fracture.  Left pleural effusion.   Original Report Authenticated By: Kennith Center, M.D.   Ct Cervical Spine Wo Contrast  05/29/2013   *RADIOLOGY REPORT*  Clinical Data:  Found down  CT HEAD WITHOUT CONTRAST CT CERVICAL SPINE WITHOUT CONTRAST  Technique:  Multidetector CT imaging of the head and cervical spine was performed following the standard protocol without intravenous contrast.  Multiplanar CT image reconstructions of the cervical spine were also generated.  Comparison:  Head CT from 02/28/2011  CT HEAD  Findings: There is no evidence for acute hemorrhage, hydrocephalus, mass lesion, or abnormal extra-axial fluid collection.  No definite CT evidence for acute infarction.  Diffuse loss of  parenchymal volume is consistent with atrophy.  Focal nonacute infarct in the posterior left frontal lobe is new in the interval since the prior study. Patchy low attenuation in the deep hemispheric and periventricular white matter is nonspecific, but likely reflects chronic microvascular ischemic demyelination.  The visualized paranasal sinuses and mastoid air cells are clear.  IMPRESSION: No acute intracranial abnormality.  Atrophy with chronic small vessel white matter ischemic demyelination.  Nonacute infarct involving the posterior left frontal lobe.  CT CERVICAL SPINE  Findings: Imaging was obtained from the skull base to the C7-T1 interspace.  No evidence for fracture.  There is loss of intervertebral disc space from C3-4 down to C6-7 with associated endplate degeneration.  Diffuse facet osteoarthritis is also noted bilaterally, but most prominent at CT 3N C3-4 on the right.  No evidence for prevertebral soft tissue swelling.  Partial imaging of the left apex reveals a pleural effusion.  IMPRESSION: Diffuse degenerative changes in the cervical spine without evidence for an acute fracture.  Left pleural effusion.   Original Report Authenticated By: Kennith Center, M.D.   Dg Chest Port 1 View  05/29/2013   *RADIOLOGY REPORT*  Clinical Data: Chest pain  PORTABLE CHEST - 1 VIEW  Comparison: 12/22/2012  Findings: Low volume rotated film. The cardiopericardial silhouette is enlarged.  There is collapse / consolidation at the left base with a small left pleural effusion.  The patient is status post median sternotomy with multiple fractured mediastinal wires. Telemetry leads overlie the chest.  IMPRESSION: Cardiomegaly with left base collapse / consolidation and small left pleural effusion.   Original Report Authenticated By: Kennith Center, M.D.   Dg Shoulder Left  05/29/2013   *RADIOLOGY REPORT*  Clinical Data: Left shoulder and hand abrasions after a fall.  LEFT SHOULDER - 2+ VIEW  Comparison: No priors.   Findings: Three views of the left shoulder demonstrate no acute displaced fracture, subluxation, dislocation, joint or soft tissue abnormality.  IMPRESSION: 1.  No acute radiographic abnormality of the left shoulder.   Original Report Authenticated By: Trudie Reed, M.D.   Dg Hand Complete Left  05/29/2013   *RADIOLOGY REPORT*  Clinical Data: Fall  LEFT HAND - COMPLETE 3+ VIEW  Comparison: None.  Findings: Three views of the left hand submitted.  No acute fracture or subluxation.  Mild narrowing of radiocarpal joint space.  IMPRESSION: No acute fracture or subluxation.   Original Report Authenticated By: Natasha Mead, M.D.  EKG: Independently reviewed. sinus tachycardia, Q waves V1 V2 V3, negative T waves in inferolateral leads  Assessment/Plan Principal Problem:   AKI (acute kidney injury) Active Problems:   Coronary artery disease   Hypertension   CVA (cerebral infarction)   BPH (benign prostatic hyperplasia)   Rhabdomyolysis   Systolic and diastolic CHF, chronic   NSTEMI (non-ST elevated myocardial infarction)   Idiopathic polyneuropathy   1. Fall and probable syncope in a patient with known coronary disease and remote CABG who now presents with changes on the EKG suggestive of an old myocardial infarction and elevated troponins. Plan to admit to inpatient start aspirin and beta blocker and imdur and obtain cardiology consultation.  Since the patient is not having active chest pain we will not start IV heparin. We will order an echocardiogram to evaluate for wall motion abnormalities and ejection fraction 2. Fall with rhabdomyolysis and acute renal failure-we'll gently hydrate through the night repeat CK and basic metabolic profile tomorrow. 3. Polyneuropathy with gait instability and old strokes-we'll get physical therapy and occupational therapy consultation  4. Hypertension-continue beta blocker and Imdur 5. Hyperglycemia-probably reactive 6. Incidental finding of a left pleural  effusion-with patient falling on his left side this could represent hemothorax. We'll repeat chest x-ray in the morning.  7. Patient is requesting to be a DO NOT RESUSCITATE and the niece at the bedside nods in agreement suggesting that he has made these arrangements prior to this admission. The patient's son is driving from [place] currently and he will be contacting us as soon as she arrives at the hospital.     Code Status:  DO NOT RESUSCITATE (must indicate code status--if unknown or must be presumed, indicate so) Family Communication:  niece at bedside (indicate person spoken with, if applicable, with phone number if by telephone) Disposition Plan:  unclear (indicate anticipated LOS)  Time spent:  one hour  Emmy Keng [facility] Pager [phone]  If 7PM-7AM, please contact night-coverage [email or web address] Password [email or web address] 05/29/2013, 3:59 PM

## 2013-05-29 NOTE — Consult Note (Addendum)
Admit date: 05/29/2013 Referring Physician  Dr. Ranae Palms Primary Physician [name], MD Primary Cardiologist  Dr. Katrinka Blazing Reason for Consultation  Elevated troponin after fall  HPI: 79 year old male with coronary artery disease status post bypass surgery in 1997 with probable bypass graft failure, stable coronary artery disease, hypertension, prior stroke, prior nonhealing sternum post surgery with weight restrictions who unfortunately tripped and fell last night both he and his wife, denying loss of consciousness but unable to get off the floor. Here in the emergency room, troponin was drawn and was 2.4. His CK was in the 3000 range. EKG showed sinus tachycardia rate 104 with nonspecific T-wave changes, mild T wave inversion in the lateral leads, PVC. He is not complaining of any chest pain, shortness of breath, fevers, chills. Challenging to obtain a clear history from him. He does clearly states that he and his wife fell and that he was weak. He denies any chest pain.    PMH:   Past Medical History  Diagnosis Date  . Coronary artery disease   . Hypertension   . Angina at rest   . Hyperlipidemia   . CVA (cerebral infarction)   . BPH (benign prostatic hyperplasia)     PSH:   Past Surgical History  Procedure Laterality Date  . Coronary artery bypass graft    . Hernia repair    . Appendectomy     Allergies:  Baycol; Codeine; Lescol; Lipitor; Morphine and related; Zetia; and Zocor Prior to Admit Meds:   (Not in a hospital admission) Prior to Admission medications   Medication Sig Start Date End Date Taking? Authorizing Provider  cephALEXin (KEFLEX) 500 MG capsule Take 500 mg by mouth 3 (three) times daily. Unknown reason rx date 05/20/13.   Yes Historical Provider, MD  clopidogrel (PLAVIX) 75 MG tablet Take 75 mg by mouth daily.   Yes Historical Provider, MD  finasteride (PROSCAR) 5 MG tablet Take 5 mg by mouth daily.   Yes Historical Provider, MD  furosemide (LASIX) 20 MG tablet  Take 20 mg by mouth 2 (two) times daily.   Yes Historical Provider, MD  isosorbide mononitrate (IMDUR) 120 MG 24 hr tablet Take 120 mg by mouth daily.   Yes Historical Provider, MD  metoprolol succinate (TOPROL-XL) 25 MG 24 hr tablet Take 25 mg by mouth daily.   Yes Historical Provider, MD  nitroGLYCERIN (NITROSTAT) 0.4 MG SL tablet Place 0.4 mg under the tongue every 5 (five) minutes as needed for chest pain.   Yes Historical Provider, MD  pravastatin (PRAVACHOL) 20 MG tablet Take 20 mg by mouth daily.   Yes Historical Provider, MD   Gwenyth Ober:    His mother died in a homicide. Father unknown. Social HX:    History of smoking  cigarettes: Former smoker Quit in year 1985 no Smoking, quit >25 years ago..  no Alcohol.  no Recreational drug use.  Occupation: Retired Occupational hygienist.  Marital Status: married.  Children: One son.  [facility] [employer] vet.  History   Social History  . Marital Status: Single    Spouse Name: N/A    Number of Children: N/A  . Years of Education: N/A   Occupational History  . Not on file.   Social History Main Topics  . Smoking status: Never Smoker   . Smokeless tobacco: Not on file  . Alcohol Use: No  . Drug Use: No  . Sexually Active: Not on file   Other Topics Concern  . Not on file   Social History  Narrative  . No narrative on file     ROS:  Recent falls, recent lower extremity edema, dementia, difficulty with medical compliance, difficult social situation. All 11 ROS were addressed and are negative except what is stated in the HPI. Difficult to obtain a clear history.  Physical Exam: Blood pressure 147/82, pulse 102, temperature 97.6 F (36.4 C), temperature source Oral, resp. rate 28, SpO2 94.00%.    General: Elderly, in no acute distress, laying in bed, left part of gown with blood. Head: Eyes PERRLA, No xanthomas. Extremely dry mouth, tacky mucous membranes. C. collar has been removed Lungs:   Clear bilaterally to auscultation and percussion. Normal respiratory  effort. No wheezes, no rales. Heart:   HRRR S1 S2 Pulses are 2+ & equal. Frequent ectopy No significant murmurs, no rubs           No carotid bruit. No JVD.  No abdominal bruits. Abdomen: Bowel sounds are positive, abdomen soft and non-tender without masses. No hepatosplenomegaly. Msk:  Back normal. Decreased strength and tone for age. Ecchymosis, right buttocks, right calf Extremities:   No clubbing, cyanosis. 1+ edema bilateral lower extremities.  DP +1 Neuro: Alert, appears to be oriented, non-focal, MAE x 4 GU: Deferred Rectal: Deferred Psych:  Good affect, responds appropriately but may be confused at times    Labs:   Lab Results  Component Value Date   WBC 13.6* 05/29/2013   HGB 14.1 05/29/2013   HCT 41.8 05/29/2013   MCV 91.5 05/29/2013   PLT 283 05/29/2013    Recent Labs Lab 05/29/13 1350  NA 141  K 4.2  CL 105  CO2 21  BUN 25*  CREATININE 1.45*  CALCIUM 9.1  PROT 6.5  BILITOT 1.0  ALKPHOS 73  ALT 28  AST 65*  GLUCOSE 218*   Lab Results  Component Value Date   CKTOTAL 3115* 05/29/2013   CKMB 3.0 02/28/2011   TROPONINI 2.47* 05/29/2013        Radiology:  Ct Head Wo Contrast  05/29/2013   *RADIOLOGY REPORT*  Clinical Data:  Found down  CT HEAD WITHOUT CONTRAST CT CERVICAL SPINE WITHOUT CONTRAST  Technique:  Multidetector CT imaging of the head and cervical spine was performed following the standard protocol without intravenous contrast.  Multiplanar CT image reconstructions of the cervical spine were also generated.  Comparison:  Head CT from 02/28/2011  CT HEAD  Findings: There is no evidence for acute hemorrhage, hydrocephalus, mass lesion, or abnormal extra-axial fluid collection.  No definite CT evidence for acute infarction.  Diffuse loss of parenchymal volume is consistent with atrophy.  Focal nonacute infarct in the posterior left frontal lobe is new in the interval since the prior study. Patchy low attenuation in the deep hemispheric and periventricular white matter is  nonspecific, but likely reflects chronic microvascular ischemic demyelination.  The visualized paranasal sinuses and mastoid air cells are clear.  IMPRESSION: No acute intracranial abnormality.  Atrophy with chronic small vessel white matter ischemic demyelination.  Nonacute infarct involving the posterior left frontal lobe.  CT CERVICAL SPINE  Findings: Imaging was obtained from the skull base to the C7-T1 interspace.  No evidence for fracture.  There is loss of intervertebral disc space from C3-4 down to C6-7 with associated endplate degeneration.  Diffuse facet osteoarthritis is also noted bilaterally, but most prominent at CT 3N C3-4 on the right.  No evidence for prevertebral soft tissue swelling.  Partial imaging of the left apex reveals a pleural effusion.  IMPRESSION: Diffuse  degenerative changes in the cervical spine without evidence for an acute fracture.  Left pleural effusion.   Original Report Authenticated By: Kennith Center, M.D.   Ct Cervical Spine Wo Contrast  05/29/2013   *RADIOLOGY REPORT*  Clinical Data:  Found down  CT HEAD WITHOUT CONTRAST CT CERVICAL SPINE WITHOUT CONTRAST  Technique:  Multidetector CT imaging of the head and cervical spine was performed following the standard protocol without intravenous contrast.  Multiplanar CT image reconstructions of the cervical spine were also generated.  Comparison:  Head CT from 02/28/2011  CT HEAD  Findings: There is no evidence for acute hemorrhage, hydrocephalus, mass lesion, or abnormal extra-axial fluid collection.  No definite CT evidence for acute infarction.  Diffuse loss of parenchymal volume is consistent with atrophy.  Focal nonacute infarct in the posterior left frontal lobe is new in the interval since the prior study. Patchy low attenuation in the deep hemispheric and periventricular white matter is nonspecific, but likely reflects chronic microvascular ischemic demyelination.  The visualized paranasal sinuses and mastoid air cells are  clear.  IMPRESSION: No acute intracranial abnormality.  Atrophy with chronic small vessel white matter ischemic demyelination.  Nonacute infarct involving the posterior left frontal lobe.  CT CERVICAL SPINE  Findings: Imaging was obtained from the skull base to the C7-T1 interspace.  No evidence for fracture.  There is loss of intervertebral disc space from C3-4 down to C6-7 with associated endplate degeneration.  Diffuse facet osteoarthritis is also noted bilaterally, but most prominent at CT 3N C3-4 on the right.  No evidence for prevertebral soft tissue swelling.  Partial imaging of the left apex reveals a pleural effusion.  IMPRESSION: Diffuse degenerative changes in the cervical spine without evidence for an acute fracture.  Left pleural effusion.   Original Report Authenticated By: Kennith Center, M.D.   Dg Chest Port 1 View  05/29/2013   *RADIOLOGY REPORT*  Clinical Data: Chest pain  PORTABLE CHEST - 1 VIEW  Comparison: 12/22/2012  Findings: Low volume rotated film. The cardiopericardial silhouette is enlarged.  There is collapse / consolidation at the left base with a small left pleural effusion.  The patient is status post median sternotomy with multiple fractured mediastinal wires. Telemetry leads overlie the chest.  IMPRESSION: Cardiomegaly with left base collapse / consolidation and small left pleural effusion.   Original Report Authenticated By: Kennith Center, M.D.   Dg Shoulder Left  05/29/2013   *RADIOLOGY REPORT*  Clinical Data: Left shoulder and hand abrasions after a fall.  LEFT SHOULDER - 2+ VIEW  Comparison: No priors.  Findings: Three views of the left shoulder demonstrate no acute displaced fracture, subluxation, dislocation, joint or soft tissue abnormality.  IMPRESSION: 1.  No acute radiographic abnormality of the left shoulder.   Original Report Authenticated By: Trudie Reed, M.D.   Dg Hand Complete Left  05/29/2013   *RADIOLOGY REPORT*  Clinical Data: Fall  LEFT HAND - COMPLETE 3+  VIEW  Comparison: None.  Findings: Three views of the left hand submitted.  No acute fracture or subluxation.  Mild narrowing of radiocarpal joint space.  IMPRESSION: No acute fracture or subluxation.   Original Report Authenticated By: Natasha Mead, M.D.   Personally viewed.  EKG:  As described above in history of present illness. PVCs were noted on prior EKG in April of 2014 however T-wave inversion in the lateral leads was not as significant. He did have nonspecific ST-T wave changes back at that time. Personally viewed.   ASSESSMENT/PLAN:   79 year old  male with coronary artery disease status post bypass in 1997 with probable bypass failure, memory impairment, significant fall, weakness, with inability to stand with rhabdomyolysis as well as elevated troponin.  1. Elevated troponin-2.4. CK is quite significantly elevated as well. MB is reassuring. It is likely that his elevated troponin is secondary to a type II NSTEMI event not true ACS/thrombosis. Demand ischemia likely. Subtle EKG changes noted in lateral leads. With his recent fall, I would not advocate IV heparinization/full dose anticoagulation. I would continue to advocate his home dose of Plavix if no evidence of significant bleeding apparent. Continue with other antianginal medications such as isosorbide and metoprolol. Hydrate as necessary. Mild Acute kidney injury noted with mildly elevated creatinine. Creatinine was 1.34 in April of 2014. Continue with medical therapy. I would not advocate invasive cardiac workup. Last ejection fraction 2012 is 45-50%, mildly reduced. Not unreasonable to reassess echocardiogram.  2. Coronary artery disease-status post bypass surgery. No active anginal symptoms. Continue with current secondary prevention. Prior mediastinal sternal fractured wires with weight restriction on lifting.  3. Memory impairment/weakness/falls-physical therapy, may very well not be safe to be at home, both he and his wife.  Will  relay to Dr. Katrinka Blazing.  Donato Schultz, MD  05/29/2013  3:52 PM

## 2013-05-29 NOTE — ED Notes (Signed)
TO ED via [facility] -- found on floor with wife, by niece this am. Niece went to house to check on pt and wife. Found door locked, called locksmith, police and EMS. Once inside, pt and wife were located on the floor in the bathroom- carpeted floor. States has been on the floor since yesterday. Alert, oriented to place, able to follow commands

## 2013-05-30 ENCOUNTER — Inpatient Hospital Stay (HOSPITAL_COMMUNITY): Payer: Medicare Other

## 2013-05-30 DIAGNOSIS — I10 Essential (primary) hypertension: Secondary | ICD-10-CM

## 2013-05-30 LAB — CBC
HCT: 36.6 % — ABNORMAL LOW (ref 39.0–52.0)
Hemoglobin: 12.5 g/dL — ABNORMAL LOW (ref 13.0–17.0)
MCH: 30.9 pg (ref 26.0–34.0)
MCHC: 34.2 g/dL (ref 30.0–36.0)
MCV: 90.6 fL (ref 78.0–100.0)
Platelets: 239 K/uL (ref 150–400)
RBC: 4.04 MIL/uL — ABNORMAL LOW (ref 4.22–5.81)
RDW: 14.1 % (ref 11.5–15.5)
WBC: 17.9 K/uL — ABNORMAL HIGH (ref 4.0–10.5)

## 2013-05-30 LAB — TROPONIN I
Troponin I: 1.69 ng/mL (ref <0.30)
Troponin I: 0.73 ng/mL (ref <0.30)
Troponin I: 0.82 ng/mL (ref <0.30)
Troponin I: 0.82 ng/mL (ref <0.30)

## 2013-05-30 LAB — HEMOGLOBIN A1C
Hgb A1c MFr Bld: 5.8 % — ABNORMAL HIGH (ref <5.7)
Mean Plasma Glucose: 120 mg/dL — ABNORMAL HIGH (ref <117)

## 2013-05-30 LAB — BASIC METABOLIC PANEL
BUN: 26 mg/dL — ABNORMAL HIGH (ref 6–23)
Calcium: 8.4 mg/dL (ref 8.4–10.5)
Chloride: 109 mEq/L (ref 96–112)
Creatinine, Ser: 1.37 mg/dL — ABNORMAL HIGH (ref 0.50–1.35)
GFR calc Af Amer: 51 mL/min — ABNORMAL LOW (ref >90)
GFR calc non Af Amer: 44 mL/min — ABNORMAL LOW (ref >90)

## 2013-05-30 LAB — CK: Total CK: 2738 U/L — ABNORMAL HIGH (ref 7–232)

## 2013-05-30 NOTE — Progress Notes (Signed)
SUBJECTIVE:  No complaints  OBJECTIVE:   Vitals:   Filed Vitals:   05/29/13 1700 05/29/13 1830 05/29/13 2154 05/30/13 0435  BP: 147/77 144/96 120/80 111/64  Pulse: 93 105 94 76  Temp:  98.6 F (37 C) 98.3 F (36.8 C) 97.7 F (36.5 C)  TempSrc:  Oral Axillary Oral  Resp: 20 20 18    Height:  6' (1.829 m)    Weight:  83.9 kg (184 lb 15.5 oz)  85.73 kg (189 lb)  SpO2: 97% 93% 94% 96%   I&O's:   Intake/Output Summary (Last 24 hours) at 05/30/13 1049 Last data filed at 05/30/13 0549  Gross per 24 hour  Intake   1480 ml  Output    325 ml  Net   1155 ml   TELEMETRY: Reviewed telemetry pt in : Normal sinus rhythm with PACs     PHYSICAL EXAM General: Well developed, well nourished, in no acute distress   Lungs:  No wheezing. Heart:  Irregular, normal rate Abdomen: Mild obesity  Extremities:  Trace pretibial edema edema.  DP +1    LABS: Basic Metabolic Panel:  Recent Labs  16/10/96 1350 05/30/13 0125  NA 141 139  K 4.2 3.6  CL 105 109  CO2 21 23  GLUCOSE 218* 154*  BUN 25* 26*  CREATININE 1.45* 1.37*  CALCIUM 9.1 8.4   Liver Function Tests:  Recent Labs  05/29/13 1350  AST 65*  ALT 28  ALKPHOS 73  BILITOT 1.0  PROT 6.5  ALBUMIN 3.4*   No results found for this basename: LIPASE, AMYLASE,  in the last 72 hours CBC:  Recent Labs  05/29/13 1350 05/30/13 0125  WBC 13.6* 17.9*  NEUTROABS 12.6*  --   HGB 14.1 12.5*  HCT 41.8 36.6*  MCV 91.5 90.6  PLT 283 239   Cardiac Enzymes:  Recent Labs  05/29/13 1350 05/29/13 1351 05/29/13 2016 05/30/13 0125  CKTOTAL 3115*  --   --  2738*  TROPONINI  --  2.47* 1.84* 1.69*   BNP: No components found with this basename: POCBNP,  D-Dimer: No results found for this basename: DDIMER,  in the last 72 hours Hemoglobin A1C:  Recent Labs  05/29/13 2016  HGBA1C 5.8*   Fasting Lipid Panel: No results found for this basename: CHOL, HDL, LDLCALC, TRIG, CHOLHDL, LDLDIRECT,  in the last 72 hours Thyroid  Function Tests: No results found for this basename: TSH, T4TOTAL, FREET3, T3FREE, THYROIDAB,  in the last 72 hours Anemia Panel: No results found for this basename: VITAMINB12, FOLATE, FERRITIN, TIBC, IRON, RETICCTPCT,  in the last 72 hours Coag Panel:   Lab Results  Component Value Date   INR 1.27 05/29/2013   INR 1.12 02/28/2011   INR 1.13 02/28/2011    RADIOLOGY: Ct Head Wo Contrast  05/29/2013   *RADIOLOGY REPORT*  Clinical Data:  Found down  CT HEAD WITHOUT CONTRAST CT CERVICAL SPINE WITHOUT CONTRAST  Technique:  Multidetector CT imaging of the head and cervical spine was performed following the standard protocol without intravenous contrast.  Multiplanar CT image reconstructions of the cervical spine were also generated.  Comparison:  Head CT from 02/28/2011  CT HEAD  Findings: There is no evidence for acute hemorrhage, hydrocephalus, mass lesion, or abnormal extra-axial fluid collection.  No definite CT evidence for acute infarction.  Diffuse loss of parenchymal volume is consistent with atrophy.  Focal nonacute infarct in the posterior left frontal lobe is new in the interval since the prior study. Patchy low  attenuation in the deep hemispheric and periventricular white matter is nonspecific, but likely reflects chronic microvascular ischemic demyelination.  The visualized paranasal sinuses and mastoid air cells are clear.  IMPRESSION: No acute intracranial abnormality.  Atrophy with chronic small vessel white matter ischemic demyelination.  Nonacute infarct involving the posterior left frontal lobe.  CT CERVICAL SPINE  Findings: Imaging was obtained from the skull base to the C7-T1 interspace.  No evidence for fracture.  There is loss of intervertebral disc space from C3-4 down to C6-7 with associated endplate degeneration.  Diffuse facet osteoarthritis is also noted bilaterally, but most prominent at CT 3N C3-4 on the right.  No evidence for prevertebral soft tissue swelling.  Partial imaging of the  left apex reveals a pleural effusion.  IMPRESSION: Diffuse degenerative changes in the cervical spine without evidence for an acute fracture.  Left pleural effusion.   Original Report Authenticated By: Kennith Center, M.D.   Ct Cervical Spine Wo Contrast  05/29/2013   *RADIOLOGY REPORT*  Clinical Data:  Found down  CT HEAD WITHOUT CONTRAST CT CERVICAL SPINE WITHOUT CONTRAST  Technique:  Multidetector CT imaging of the head and cervical spine was performed following the standard protocol without intravenous contrast.  Multiplanar CT image reconstructions of the cervical spine were also generated.  Comparison:  Head CT from 02/28/2011  CT HEAD  Findings: There is no evidence for acute hemorrhage, hydrocephalus, mass lesion, or abnormal extra-axial fluid collection.  No definite CT evidence for acute infarction.  Diffuse loss of parenchymal volume is consistent with atrophy.  Focal nonacute infarct in the posterior left frontal lobe is new in the interval since the prior study. Patchy low attenuation in the deep hemispheric and periventricular white matter is nonspecific, but likely reflects chronic microvascular ischemic demyelination.  The visualized paranasal sinuses and mastoid air cells are clear.  IMPRESSION: No acute intracranial abnormality.  Atrophy with chronic small vessel white matter ischemic demyelination.  Nonacute infarct involving the posterior left frontal lobe.  CT CERVICAL SPINE  Findings: Imaging was obtained from the skull base to the C7-T1 interspace.  No evidence for fracture.  There is loss of intervertebral disc space from C3-4 down to C6-7 with associated endplate degeneration.  Diffuse facet osteoarthritis is also noted bilaterally, but most prominent at CT 3N C3-4 on the right.  No evidence for prevertebral soft tissue swelling.  Partial imaging of the left apex reveals a pleural effusion.  IMPRESSION: Diffuse degenerative changes in the cervical spine without evidence for an acute  fracture.  Left pleural effusion.   Original Report Authenticated By: Kennith Center, M.D.   Dg Chest Port 1 View  05/30/2013   *RADIOLOGY REPORT*  Clinical Data: Left pleural effusion  PORTABLE CHEST - 1 VIEW  Comparison: Prior chest x-ray 05/29/2013  Findings: Stable moderately large left pleural effusion with associated left basilar opacity.  Additionally, there is a layering right pleural effusion with basilar atelectasis.  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  Mild pulmonary vascular congestion persists.  Aortic atherosclerosis.  IMPRESSION:  1.  Stable moderate left and smaller right layering pleural effusions with associated bibasilar atelectasis.  2.  Stable cardiomegaly and pulmonary vascular congestion   Original Report Authenticated By: Malachy Moan, M.D.   Dg Chest Port 1 View  05/29/2013   *RADIOLOGY REPORT*  Clinical Data: Chest pain  PORTABLE CHEST - 1 VIEW  Comparison: 12/22/2012  Findings: Low volume rotated film. The cardiopericardial silhouette is  enlarged.  There is collapse / consolidation at the left base with a small left pleural effusion.  The patient is status post median sternotomy with multiple fractured mediastinal wires. Telemetry leads overlie the chest.  IMPRESSION: Cardiomegaly with left base collapse / consolidation and small left pleural effusion.   Original Report Authenticated By: Kennith Center, M.D.   Dg Shoulder Left  05/29/2013   *RADIOLOGY REPORT*  Clinical Data: Left shoulder and hand abrasions after a fall.  LEFT SHOULDER - 2+ VIEW  Comparison: No priors.  Findings: Three views of the left shoulder demonstrate no acute displaced fracture, subluxation, dislocation, joint or soft tissue abnormality.  IMPRESSION: 1.  No acute radiographic abnormality of the left shoulder.   Original Report Authenticated By: Trudie Reed, M.D.   Dg Hand Complete Left  05/29/2013   *RADIOLOGY REPORT*  Clinical  Data: Fall  LEFT HAND - COMPLETE 3+ VIEW  Comparison: None.  Findings: Three views of the left hand submitted.  No acute fracture or subluxation.  Mild narrowing of radiocarpal joint space.  IMPRESSION: No acute fracture or subluxation.   Original Report Authenticated By: Natasha Mead, M.D.      ASSESSMENT: Likely type II MI, coronary artery disease  PLAN:  Low CK-MB reading.  Much of the CK is likely from noncardiac muscle.  Renal function improved today.  Would not plan anticoagulation.  Would not plan any invasive cardiac workup.  He is hemodynamically stable.  Continue supportive care.  Corky Crafts., MD  05/30/2013  10:49 AM

## 2013-05-30 NOTE — Progress Notes (Addendum)
[facility] PROGRESS NOTE  Nathan Willis ZOX:[id number] DOB: 02/19/1922 DOA: 05/29/2013 PCP: [name], MD  Assessment/Plan: Non-ST elevated MI -Continue supportive care. -No interventional cardiac workup is planned for at this time. -Continue aspirin, Imdur, metoprolol. -Appreciate cardiology assistance.  Rhabdomyolysis -From prolonged stay on the floor. -Is improving nicely with IV fluids.  Bilateral pleural effusion -Left greater than right. -Repeat chest x-ray shows that it is stable. -Patient is asymptomatic from this, no thoracentesis is planned at this time.  Fall/deconditioning -PT/OT evaluations are pending. -I suspect that skilled nursing facility may be the best venue for him at this time.  Code Status: DO NOT RESUSCITATE Family Communication: None today  Disposition Plan: Pending therapy evaluations   Consultants:  Cardiology, Dr. Anne Fu    Antibiotics:  None   Subjective: Lying in bed, no complaints, no family present.  Objective: Filed Vitals:   05/29/13 1830 05/29/13 2154 05/30/13 0435 05/30/13 1059  BP: 144/96 120/80 111/64 120/70  Pulse: 105 94 76 80  Temp: 98.6 F (37 C) 98.3 F (36.8 C) 97.7 F (36.5 C)   TempSrc: Oral Axillary Oral   Resp: 20 18    Height: 6' (1.829 m)     Weight: 83.9 kg (184 lb 15.5 oz)  85.73 kg (189 lb)   SpO2: 93% 94% 96%     Intake/Output Summary (Last 24 hours) at 05/30/13 1100 Last data filed at 05/30/13 0549  Gross per 24 hour  Intake   1480 ml  Output    325 ml  Net   1155 ml   Filed Weights   05/29/13 1830 05/30/13 0435  Weight: 83.9 kg (184 lb 15.5 oz) 85.73 kg (189 lb)    Exam:   General:  Awake, oriented to person and place  Cardiovascular: Regular rate and rhythm, no murmurs rubs or gallops  Respiratory: Clear to auscultation bilaterally  Abdomen: Soft, nontender, nondistended, positive bowel sounds  Extremities: Trace bilateral pitting edema   Neurologic:  Nonfocal, I  have not ambulated him  Data Reviewed: Basic Metabolic Panel:  Recent Labs Lab 05/29/13 1350 05/30/13 0125  NA 141 139  K 4.2 3.6  CL 105 109  CO2 21 23  GLUCOSE 218* 154*  BUN 25* 26*  CREATININE 1.45* 1.37*  CALCIUM 9.1 8.4   Liver Function Tests:  Recent Labs Lab 05/29/13 1350  AST 65*  ALT 28  ALKPHOS 73  BILITOT 1.0  PROT 6.5  ALBUMIN 3.4*   No results found for this basename: LIPASE, AMYLASE,  in the last 168 hours No results found for this basename: AMMONIA,  in the last 168 hours CBC:  Recent Labs Lab 05/29/13 1350 05/30/13 0125  WBC 13.6* 17.9*  NEUTROABS 12.6*  --   HGB 14.1 12.5*  HCT 41.8 36.6*  MCV 91.5 90.6  PLT 283 239   Cardiac Enzymes:  Recent Labs Lab 05/29/13 1350 05/29/13 1351 05/29/13 2016 05/30/13 0125  CKTOTAL 3115*  --   --  2738*  TROPONINI  --  2.47* 1.84* 1.69*   BNP (last 3 results) No results found for this basename: PROBNP,  in the last 8760 hours CBG: No results found for this basename: GLUCAP,  in the last 168 hours  No results found for this or any previous visit (from the past 240 hour(s)).   Studies: Ct Head Wo Contrast  05/29/2013   *RADIOLOGY REPORT*  Clinical Data:  Found down  CT HEAD WITHOUT CONTRAST CT CERVICAL SPINE WITHOUT CONTRAST  Technique:  Multidetector  CT imaging of the head and cervical spine was performed following the standard protocol without intravenous contrast.  Multiplanar CT image reconstructions of the cervical spine were also generated.  Comparison:  Head CT from 02/28/2011  CT HEAD  Findings: There is no evidence for acute hemorrhage, hydrocephalus, mass lesion, or abnormal extra-axial fluid collection.  No definite CT evidence for acute infarction.  Diffuse loss of parenchymal volume is consistent with atrophy.  Focal nonacute infarct in the posterior left frontal lobe is new in the interval since the prior study. Patchy low attenuation in the deep hemispheric and periventricular white matter  is nonspecific, but likely reflects chronic microvascular ischemic demyelination.  The visualized paranasal sinuses and mastoid air cells are clear.  IMPRESSION: No acute intracranial abnormality.  Atrophy with chronic small vessel white matter ischemic demyelination.  Nonacute infarct involving the posterior left frontal lobe.  CT CERVICAL SPINE  Findings: Imaging was obtained from the skull base to the C7-T1 interspace.  No evidence for fracture.  There is loss of intervertebral disc space from C3-4 down to C6-7 with associated endplate degeneration.  Diffuse facet osteoarthritis is also noted bilaterally, but most prominent at CT 3N C3-4 on the right.  No evidence for prevertebral soft tissue swelling.  Partial imaging of the left apex reveals a pleural effusion.  IMPRESSION: Diffuse degenerative changes in the cervical spine without evidence for an acute fracture.  Left pleural effusion.   Original Report Authenticated By: Kennith Center, M.D.   Ct Cervical Spine Wo Contrast  05/29/2013   *RADIOLOGY REPORT*  Clinical Data:  Found down  CT HEAD WITHOUT CONTRAST CT CERVICAL SPINE WITHOUT CONTRAST  Technique:  Multidetector CT imaging of the head and cervical spine was performed following the standard protocol without intravenous contrast.  Multiplanar CT image reconstructions of the cervical spine were also generated.  Comparison:  Head CT from 02/28/2011  CT HEAD  Findings: There is no evidence for acute hemorrhage, hydrocephalus, mass lesion, or abnormal extra-axial fluid collection.  No definite CT evidence for acute infarction.  Diffuse loss of parenchymal volume is consistent with atrophy.  Focal nonacute infarct in the posterior left frontal lobe is new in the interval since the prior study. Patchy low attenuation in the deep hemispheric and periventricular white matter is nonspecific, but likely reflects chronic microvascular ischemic demyelination.  The visualized paranasal sinuses and mastoid air cells  are clear.  IMPRESSION: No acute intracranial abnormality.  Atrophy with chronic small vessel white matter ischemic demyelination.  Nonacute infarct involving the posterior left frontal lobe.  CT CERVICAL SPINE  Findings: Imaging was obtained from the skull base to the C7-T1 interspace.  No evidence for fracture.  There is loss of intervertebral disc space from C3-4 down to C6-7 with associated endplate degeneration.  Diffuse facet osteoarthritis is also noted bilaterally, but most prominent at CT 3N C3-4 on the right.  No evidence for prevertebral soft tissue swelling.  Partial imaging of the left apex reveals a pleural effusion.  IMPRESSION: Diffuse degenerative changes in the cervical spine without evidence for an acute fracture.  Left pleural effusion.   Original Report Authenticated By: Kennith Center, M.D.   Dg Chest Port 1 View  05/30/2013   *RADIOLOGY REPORT*  Clinical Data: Left pleural effusion  PORTABLE CHEST - 1 VIEW  Comparison: Prior chest x-ray 05/29/2013  Findings: Stable moderately large left pleural effusion with associated left basilar opacity.  Additionally, there is a layering right pleural effusion with basilar atelectasis.  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  Mild pulmonary vascular congestion persists.  Aortic atherosclerosis.  IMPRESSION:  1.  Stable moderate left and smaller right layering pleural effusions with associated bibasilar atelectasis.  2.  Stable cardiomegaly and pulmonary vascular congestion   Original Report Authenticated By: Malachy Moan, M.D.   Dg Chest Port 1 View  05/29/2013   *RADIOLOGY REPORT*  Clinical Data: Chest pain  PORTABLE CHEST - 1 VIEW  Comparison: 12/22/2012  Findings: Low volume rotated film. The cardiopericardial silhouette is enlarged.  There is collapse / consolidation at the left base with a small left pleural effusion.  The patient is status post median sternotomy with  multiple fractured mediastinal wires. Telemetry leads overlie the chest.  IMPRESSION: Cardiomegaly with left base collapse / consolidation and small left pleural effusion.   Original Report Authenticated By: Kennith Center, M.D.   Dg Shoulder Left  05/29/2013   *RADIOLOGY REPORT*  Clinical Data: Left shoulder and hand abrasions after a fall.  LEFT SHOULDER - 2+ VIEW  Comparison: No priors.  Findings: Three views of the left shoulder demonstrate no acute displaced fracture, subluxation, dislocation, joint or soft tissue abnormality.  IMPRESSION: 1.  No acute radiographic abnormality of the left shoulder.   Original Report Authenticated By: Trudie Reed, M.D.   Dg Hand Complete Left  05/29/2013   *RADIOLOGY REPORT*  Clinical Data: Fall  LEFT HAND - COMPLETE 3+ VIEW  Comparison: None.  Findings: Three views of the left hand submitted.  No acute fracture or subluxation.  Mild narrowing of radiocarpal joint space.  IMPRESSION: No acute fracture or subluxation.   Original Report Authenticated By: Natasha Mead, M.D.    Scheduled Meds: . aspirin EC  325 mg Oral Daily  . clopidogrel  75 mg Oral Daily  . docusate sodium  100 mg Oral BID  . enoxaparin (LOVENOX) injection  40 mg Subcutaneous Q24H  . finasteride  5 mg Oral Daily  . isosorbide mononitrate  120 mg Oral Daily  . metoprolol succinate  25 mg Oral Daily  . sodium chloride  3 mL Intravenous Q12H   Continuous Infusions:   Principal Problem:   NSTEMI (non-ST elevated myocardial infarction) Active Problems:   Coronary artery disease   Hypertension   CVA (cerebral infarction)   BPH (benign prostatic hyperplasia)   Rhabdomyolysis   Systolic and diastolic CHF, chronic   ARF (acute renal failure)   Idiopathic polyneuropathy   Pleural effusion on left    Time spent: 35 minutes    [name],Nathan Willis  [facility] Pager [phone]  If 7PM-7AM, please contact night-coverage at [email or web address], password [facility] 05/30/2013, 11:00 AM  LOS: 1  day

## 2013-05-30 NOTE — Evaluation (Signed)
Physical Therapy Evaluation Patient Details Name: Nathan Willis MRN: [id number] DOB: 1922/07/10 Today's Date: 05/30/2013 Time: 4098-1191 PT Time Calculation (min): 16 min  PT Assessment / Plan / Recommendation Clinical Impression  Patient is an 79 yo male admitted following a fall with AKI, NSTEMI, confusion.  Patient required Max to +2 total assist for mobility.  Will benefit from acute PT to maximize independence prior to discharge.  Do not feel patient is safe to be at home.  Recommend SNF at discharge for continued therapy.    PT Assessment  Patient needs continued PT services    Follow Up Recommendations  SNF    Does the patient have the potential to tolerate intense rehabilitation      Barriers to Discharge Decreased caregiver support      Equipment Recommendations  None recommended by PT    Recommendations for Other Services     Frequency Min 2X/week    Precautions / Restrictions Precautions Precautions: Fall Restrictions Weight Bearing Restrictions: No   Pertinent Vitals/Pain       Mobility  Bed Mobility Bed Mobility: Supine to Sit;Sitting - Scoot to [place] of Bed;Sit to Supine;Scooting to [facility] Supine to Sit: 1: +1 Total assist;HOB flat Sitting - Scoot to Edge of Bed: 3: Mod assist;With rail Sit to Supine: 1: +1 Total assist;HOB flat Scooting to HOB: 1: +2 Total assist Scooting to [facility]: Patient Percentage: 10% Details for Bed Mobility Assistance: Verbal and tactile cues for technique.  Patient reaching for PT to pull patient up.  Redirected patient to use rail and push up from bed.  Transfers Transfers: Sit to Stand;Stand to Sit Sit to Stand: 2: Max assist;From elevated surface;With upper extremity assist;From bed Stand to Sit: 2: Max assist;With upper extremity assist;To bed Details for Transfer Assistance: Verbal cues for hand placement.  Attempted standing x2 - unable.  Elevated bed and patient able to rise to standing with max  assist. Ambulation/Gait Ambulation/Gait Assistance: 1: +2 Total assist Ambulation/Gait: Patient Percentage: 40% Ambulation Distance (Feet): 1 Feet Assistive device: Rolling walker Ambulation/Gait Assistance Details: Patient attempted to take steps - exhibited festinating gait.  Was able to take 2 steps laterally to move up in bed. Gait Pattern: Decreased step length - right;Decreased step length - left;Festinating;Shuffle;Trunk flexed    Exercises     PT Diagnosis: Difficulty walking;Abnormality of gait;Generalized weakness;Altered mental status  PT Problem List: Decreased strength;Decreased activity tolerance;Decreased balance;Decreased mobility;Decreased cognition;Decreased knowledge of use of DME PT Treatment Interventions: DME instruction;Gait training;Functional mobility training;Therapeutic exercise;Balance training;Cognitive remediation;Patient/family education   PT Goals Acute Rehab PT Goals PT Goal Formulation: With patient Time For Goal Achievement: 06/13/13 Potential to Achieve Goals: Fair Pt will Roll Supine to Left Side: with min assist;with rail PT Goal: Rolling Supine to Left Side - Progress: Goal set today Pt will go Supine/Side to Sit: with min assist;with HOB 0 degrees PT Goal: Supine/Side to Sit - Progress: Goal set today Pt will go Sit to Supine/Side: with min assist;with HOB 0 degrees PT Goal: Sit to Supine/Side - Progress: Goal set today Pt will go Sit to Stand: with min assist;with upper extremity assist PT Goal: Sit to Stand - Progress: Goal set today Pt will Transfer Bed to Chair/Chair to Bed: with min assist PT Transfer Goal: Bed to Chair/Chair to Bed - Progress: Goal set today Pt will Ambulate: 16 - 50 feet;with min assist;with rolling walker PT Goal: Ambulate - Progress: Goal set today  Visit Information  Last PT Received On: 05/30/13 Assistance Needed: +2  Subjective Data  Subjective: "Can I go see my wife?"  Oriented patient. Patient Stated Goal:  To go home   Prior Functioning  Home Living Lives With: Spouse Available Help at Discharge: Skilled Nursing Facility Prior Function Level of Independence: Independent Driving: Yes Vocation: Retired Musician: Expressive difficulties (Difficulty understanding patient at times)    Cognition  Cognition Arousal/Alertness: Awake/alert Behavior During Therapy: Anxious Overall Cognitive Status: Impaired/Different from baseline Area of Impairment: Orientation;Memory;Following commands;Safety/judgement Orientation Level: Disoriented to;Time;Situation Memory: Decreased short-term memory Following Commands: Follows one step commands inconsistently;Follows one step commands with increased time Safety/Judgement: Decreased awareness of deficits    Extremity/Trunk Assessment Right Upper Extremity Assessment RUE ROM/Strength/Tone: Deficits RUE ROM/Strength/Tone Deficits: Strength 4-/5 Left Upper Extremity Assessment LUE ROM/Strength/Tone: Deficits LUE ROM/Strength/Tone Deficits: Strength 4-/5 Right Lower Extremity Assessment RLE ROM/Strength/Tone: Deficits RLE ROM/Strength/Tone Deficits: Strength 4-/5 RLE Sensation: History of peripheral neuropathy RLE Coordination: Deficits RLE Coordination Deficits: Decreased heel-to-shin Left Lower Extremity Assessment LLE ROM/Strength/Tone: Deficits LLE ROM/Strength/Tone Deficits: Strength 4-/5 LLE Sensation: History of peripheral neuropathy LLE Coordination: Deficits LLE Coordination Deficits: Decreased heel-to-shin Trunk Assessment Trunk Assessment: Kyphotic   Balance Balance Balance Assessed: Yes Static Sitting Balance Static Sitting - Balance Support: Bilateral upper extremity supported;Feet supported Static Sitting - Level of Assistance: 5: Stand by assistance Static Sitting - Comment/# of Minutes: 3 Static Standing Balance Static Standing - Balance Support: Bilateral upper extremity supported Static Standing - Level of  Assistance: 3: Mod assist Static Standing - Comment/# of Minutes: 2 minutes.  Patient with forward leaning posture, supporting self on RW.  End of Session PT - End of Session Equipment Utilized During Treatment: Gait belt Activity Tolerance: Patient limited by fatigue Patient left: in bed;with call bell/phone within reach Nurse Communication: Mobility status  GP     Vena Austria 05/30/2013, 12:51 PM Durenda Hurt. Renaldo Fiddler, [facility] Acute Rehab Services Pager [phone]

## 2013-05-31 DIAGNOSIS — M6282 Rhabdomyolysis: Secondary | ICD-10-CM

## 2013-05-31 DIAGNOSIS — I214 Non-ST elevation (NSTEMI) myocardial infarction: Secondary | ICD-10-CM

## 2013-05-31 LAB — CBC
Hemoglobin: 12.7 g/dL — ABNORMAL LOW (ref 13.0–17.0)
MCH: 30.3 pg (ref 26.0–34.0)
MCHC: 32.6 g/dL (ref 30.0–36.0)
RDW: 14.3 % (ref 11.5–15.5)

## 2013-05-31 LAB — BASIC METABOLIC PANEL
BUN: 25 mg/dL — ABNORMAL HIGH (ref 6–23)
Creatinine, Ser: 1.35 mg/dL (ref 0.50–1.35)
GFR calc Af Amer: 52 mL/min — ABNORMAL LOW (ref >90)
GFR calc non Af Amer: 45 mL/min — ABNORMAL LOW (ref >90)
Glucose, Bld: 141 mg/dL — ABNORMAL HIGH (ref 70–99)
Potassium: 4.2 mEq/L (ref 3.5–5.1)

## 2013-05-31 MED ORDER — PIPERACILLIN-TAZOBACTAM 3.375 G IVPB 30 MIN
3.3750 g | Freq: Once | INTRAVENOUS | Status: AC
  Administered 2013-06-01: 3.375 g via INTRAVENOUS
  Filled 2013-05-31: qty 50

## 2013-05-31 MED ORDER — VANCOMYCIN HCL IN DEXTROSE 1-5 GM/200ML-% IV SOLN
1000.0000 mg | INTRAVENOUS | Status: DC
  Administered 2013-06-01 – 2013-06-03 (×3): 1000 mg via INTRAVENOUS
  Filled 2013-05-31 (×4): qty 200

## 2013-05-31 MED ORDER — PIPERACILLIN-TAZOBACTAM 3.375 G IVPB
3.3750 g | Freq: Three times a day (TID) | INTRAVENOUS | Status: DC
  Administered 2013-06-01 – 2013-06-03 (×7): 3.375 g via INTRAVENOUS
  Filled 2013-05-31 (×9): qty 50

## 2013-05-31 NOTE — Progress Notes (Signed)
SUBJECTIVE:  Does have some pain.  When I asked him to point, he points to his abdomen and lower chest.  He states he has to use the bathroom and he wants to get up.  The nursing staff was in the room trying to convince him to stay in the bed.  They will get him a bed pan.  Currently his biggest complaint is having use the bathroom  OBJECTIVE:   Vitals:   Filed Vitals:   05/30/13 0435 05/30/13 1059 05/30/13 1300 05/30/13 2200  BP: 111/64 120/70 100/69 120/89  Pulse: 76 80 83 87  Temp: 97.7 F (36.5 C)  98.6 F (37 C) 98.1 F (36.7 C)  TempSrc: Oral  Oral Oral  Resp:    18  Height:      Weight: 85.73 kg (189 lb)     SpO2: 96%  96% 98%   I&O's:    Intake/Output Summary (Last 24 hours) at 05/31/13 0854 Last data filed at 05/30/13 2247  Gross per 24 hour  Intake    480 ml  Output    250 ml  Net    230 ml   TELEMETRY: Reviewed telemetry pt in : Normal sinus rhythm with PACs     PHYSICAL EXAM General: Well developed, well nourished, in no acute distress   Lungs:  No wheezing. Heart:  Irregular, normal rate Abdomen: Nondistended Extremities:  Trace pretibial edema edema.  DP +1    LABS: Basic Metabolic Panel:  Recent Labs  78/29/56 0125 05/31/13 0433  NA 139 138  K 3.6 4.2  CL 109 107  CO2 23 25  GLUCOSE 154* 141*  BUN 26* 25*  CREATININE 1.37* 1.35  CALCIUM 8.4 8.8   Liver Function Tests:  Recent Labs  05/29/13 1350  AST 65*  ALT 28  ALKPHOS 73  BILITOT 1.0  PROT 6.5  ALBUMIN 3.4*   No results found for this basename: LIPASE, AMYLASE,  in the last 72 hours CBC:  Recent Labs  05/29/13 1350 05/30/13 0125 05/31/13 0433  WBC 13.6* 17.9* 13.4*  NEUTROABS 12.6*  --   --   HGB 14.1 12.5* 12.7*  HCT 41.8 36.6* 39.0  MCV 91.5 90.6 93.1  PLT 283 239 236   Cardiac Enzymes:  Recent Labs  05/29/13 1350  05/30/13 0125 05/30/13 1210 05/30/13 1807 05/30/13 2230  CKTOTAL 3115*  --  2738*  --   --   --   TROPONINI  --   < > 1.69* 0.73* 0.82*  0.82*  < > = values in this interval not displayed. BNP: No components found with this basename: POCBNP,  D-Dimer: No results found for this basename: DDIMER,  in the last 72 hours Hemoglobin A1C:  Recent Labs  05/29/13 2016  HGBA1C 5.8*   Fasting Lipid Panel: No results found for this basename: CHOL, HDL, LDLCALC, TRIG, CHOLHDL, LDLDIRECT,  in the last 72 hours Thyroid Function Tests: No results found for this basename: TSH, T4TOTAL, FREET3, T3FREE, THYROIDAB,  in the last 72 hours Anemia Panel: No results found for this basename: VITAMINB12, FOLATE, FERRITIN, TIBC, IRON, RETICCTPCT,  in the last 72 hours Coag Panel:   Lab Results  Component Value Date   INR 1.27 05/29/2013   INR 1.12 02/28/2011   INR 1.13 02/28/2011    RADIOLOGY: Ct Head Wo Contrast  05/29/2013   *RADIOLOGY REPORT*  Clinical Data:  Found down  CT HEAD WITHOUT CONTRAST CT CERVICAL SPINE WITHOUT CONTRAST  Technique:  Multidetector CT imaging of  the head and cervical spine was performed following the standard protocol without intravenous contrast.  Multiplanar CT image reconstructions of the cervical spine were also generated.  Comparison:  Head CT from 02/28/2011  CT HEAD  Findings: There is no evidence for acute hemorrhage, hydrocephalus, mass lesion, or abnormal extra-axial fluid collection.  No definite CT evidence for acute infarction.  Diffuse loss of parenchymal volume is consistent with atrophy.  Focal nonacute infarct in the posterior left frontal lobe is new in the interval since the prior study. Patchy low attenuation in the deep hemispheric and periventricular white matter is nonspecific, but likely reflects chronic microvascular ischemic demyelination.  The visualized paranasal sinuses and mastoid air cells are clear.  IMPRESSION: No acute intracranial abnormality.  Atrophy with chronic small vessel white matter ischemic demyelination.  Nonacute infarct involving the posterior left frontal lobe.  CT CERVICAL SPINE   Findings: Imaging was obtained from the skull base to the C7-T1 interspace.  No evidence for fracture.  There is loss of intervertebral disc space from C3-4 down to C6-7 with associated endplate degeneration.  Diffuse facet osteoarthritis is also noted bilaterally, but most prominent at CT 3N C3-4 on the right.  No evidence for prevertebral soft tissue swelling.  Partial imaging of the left apex reveals a pleural effusion.  IMPRESSION: Diffuse degenerative changes in the cervical spine without evidence for an acute fracture.  Left pleural effusion.   Original Report Authenticated By: Kennith Center, M.D.   Ct Cervical Spine Wo Contrast  05/29/2013   *RADIOLOGY REPORT*  Clinical Data:  Found down  CT HEAD WITHOUT CONTRAST CT CERVICAL SPINE WITHOUT CONTRAST  Technique:  Multidetector CT imaging of the head and cervical spine was performed following the standard protocol without intravenous contrast.  Multiplanar CT image reconstructions of the cervical spine were also generated.  Comparison:  Head CT from 02/28/2011  CT HEAD  Findings: There is no evidence for acute hemorrhage, hydrocephalus, mass lesion, or abnormal extra-axial fluid collection.  No definite CT evidence for acute infarction.  Diffuse loss of parenchymal volume is consistent with atrophy.  Focal nonacute infarct in the posterior left frontal lobe is new in the interval since the prior study. Patchy low attenuation in the deep hemispheric and periventricular white matter is nonspecific, but likely reflects chronic microvascular ischemic demyelination.  The visualized paranasal sinuses and mastoid air cells are clear.  IMPRESSION: No acute intracranial abnormality.  Atrophy with chronic small vessel white matter ischemic demyelination.  Nonacute infarct involving the posterior left frontal lobe.  CT CERVICAL SPINE  Findings: Imaging was obtained from the skull base to the C7-T1 interspace.  No evidence for fracture.  There is loss of intervertebral disc  space from C3-4 down to C6-7 with associated endplate degeneration.  Diffuse facet osteoarthritis is also noted bilaterally, but most prominent at CT 3N C3-4 on the right.  No evidence for prevertebral soft tissue swelling.  Partial imaging of the left apex reveals a pleural effusion.  IMPRESSION: Diffuse degenerative changes in the cervical spine without evidence for an acute fracture.  Left pleural effusion.   Original Report Authenticated By: Kennith Center, M.D.   Dg Chest Port 1 View  05/30/2013   *RADIOLOGY REPORT*  Clinical Data: Left pleural effusion  PORTABLE CHEST - 1 VIEW  Comparison: Prior chest x-ray 05/29/2013  Findings: Stable moderately large left pleural effusion with associated left basilar opacity.  Additionally, there is a layering right pleural effusion with basilar atelectasis.  Unchanged cardiomegaly in this patient  status post median sternotomy for multivessel CABG including LIMA bypass.  Mild pulmonary vascular congestion persists.  Aortic atherosclerosis.  IMPRESSION:  1.  Stable moderate left and smaller right layering pleural effusions with associated bibasilar atelectasis.  2.  Stable cardiomegaly and pulmonary vascular congestion   Original Report Authenticated By: Malachy Moan, M.D.   Dg Chest Port 1 View  05/29/2013   *RADIOLOGY REPORT*  Clinical Data: Chest pain  PORTABLE CHEST - 1 VIEW  Comparison: 12/22/2012  Findings: Low volume rotated film. The cardiopericardial silhouette is enlarged.  There is collapse / consolidation at the left base with a small left pleural effusion.  The patient is status post median sternotomy with multiple fractured mediastinal wires. Telemetry leads overlie the chest.  IMPRESSION: Cardiomegaly with left base collapse / consolidation and small left pleural effusion.   Original Report Authenticated By: Kennith Center, M.D.   Dg Shoulder Left  05/29/2013   *RADIOLOGY REPORT*  Clinical Data: Left shoulder and hand abrasions after a fall.  LEFT  SHOULDER - 2+ VIEW  Comparison: No priors.  Findings: Three views of the left shoulder demonstrate no acute displaced fracture, subluxation, dislocation, joint or soft tissue abnormality.  IMPRESSION: 1.  No acute radiographic abnormality of the left shoulder.   Original Report Authenticated By: Trudie Reed, M.D.   Dg Hand Complete Left  05/29/2013   *RADIOLOGY REPORT*  Clinical Data: Fall  LEFT HAND - COMPLETE 3+ VIEW  Comparison: None.  Findings: Three views of the left hand submitted.  No acute fracture or subluxation.  Mild narrowing of radiocarpal joint space.  IMPRESSION: No acute fracture or subluxation.   Original Report Authenticated By: Natasha Mead, M.D.      ASSESSMENT: Likely type II MI, coronary artery disease  PLAN:  Low CK-MB reading.  Much of the CK is likely from noncardiac muscle.  Renal function improved today.  Would not plan anticoagulation.  Would not plan any invasive cardiac workup.  He is hemodynamically stable.  Continue supportive care.  Unclear how mentally lucid he is answering questions.  Continue with the above plan for medical therapy.  Corky Crafts., MD  05/31/2013  8:54 AM

## 2013-05-31 NOTE — Progress Notes (Signed)
ANTIBIOTIC CONSULT NOTE - INITIAL  Pharmacy Consult for Vancocin and Zosyn Indication: cellulitis  Allergies  Allergen Reactions  . Baycol (Cerivastatin) Other (See Comments)    REACTION:  unknown  . Codeine Other (See Comments)    REACTION:  "makes me crazy"  . Lescol (Fluvastatin Sodium) Other (See Comments)    REACTION:  unknown  . Lipitor (Atorvastatin) Other (See Comments)    REACTION:  "makes me violent"  . Morphine And Related Other (See Comments)    REACTION:  "makes me crazy"  . Zetia (Ezetimibe) Other (See Comments)    REACTION:  unknown  . Zocor (Simvastatin) Other (See Comments)    REACTION:  unknown    Patient Measurements: Height: 6' (182.9 cm) Weight: 189 lb (85.73 kg) IBW/kg (Calculated) : 77.6  Vital Signs: Temp: 99.6 F (37.6 C) (06/08 2100) Temp src: Oral (06/08 1335) BP: 120/73 mmHg (06/08 2100) Pulse Rate: 98 (06/08 2100) Intake/Output from previous day: 06/07 0701 - 06/08 0700 In: 480 [P.O.:480] Out: 250 [Urine:250] Intake/Output from this shift: Total I/O In: -  Out: 200 [Urine:200]  Labs:  Recent Labs  05/29/13 1350 05/30/13 0125 05/31/13 0433  WBC 13.6* 17.9* 13.4*  HGB 14.1 12.5* 12.7*  PLT 283 239 236  CREATININE 1.45* 1.37* 1.35   Estimated Creatinine Clearance: 40.7 ml/min (by C-G formula based on Cr of 1.35).   Microbiology: No results found for this or any previous visit (from the past 720 hour(s)).  Medical History: Past Medical History  Diagnosis Date  . Coronary artery disease   . Hypertension   . Angina at rest   . Hyperlipidemia   . CVA (cerebral infarction)     "at least one since 2011", denies residual (05/29/2013)  . BPH (benign prostatic hyperplasia)   . Idiopathic polyneuropathy   . Peripheral neuropathy   . Pneumonia     [name] 02/25/2001 (05/29/2013)  . Bursitis   . Skin cancer     "off face & arms" (05/29/2013)    Medications:  Prescriptions prior to admission  Medication Sig Dispense Refill  .  cephALEXin (KEFLEX) 500 MG capsule Take 500 mg by mouth 3 (three) times daily. Unknown reason rx date 05/20/13.      . clopidogrel (PLAVIX) 75 MG tablet Take 75 mg by mouth daily.      . finasteride (PROSCAR) 5 MG tablet Take 5 mg by mouth daily.      . furosemide (LASIX) 20 MG tablet Take 20 mg by mouth 2 (two) times daily.      . isosorbide mononitrate (IMDUR) 120 MG 24 hr tablet Take 120 mg by mouth daily.      . metoprolol succinate (TOPROL-XL) 25 MG 24 hr tablet Take 25 mg by mouth daily.      . nitroGLYCERIN (NITROSTAT) 0.4 MG SL tablet Place 0.4 mg under the tongue every 5 (five) minutes as needed for chest pain.      . pravastatin (PRAVACHOL) 20 MG tablet Take 20 mg by mouth daily.       Scheduled:  . aspirin EC  325 mg Oral Daily  . clopidogrel  75 mg Oral Daily  . docusate sodium  100 mg Oral BID  . enoxaparin (LOVENOX) injection  40 mg Subcutaneous Q24H  . finasteride  5 mg Oral Daily  . isosorbide mononitrate  120 mg Oral Daily  . metoprolol succinate  25 mg Oral Daily  . [START ON 06/01/2013] piperacillin-tazobactam  3.375 g Intravenous Once  . [START ON 06/01/2013]  piperacillin-tazobactam (ZOSYN)  IV  3.375 g Intravenous Q8H  . sodium chloride  3 mL Intravenous Q12H  . [START ON 06/01/2013] vancomycin  1,000 mg Intravenous Q24H    Assessment: 79yo male had been hospitalized for NSTEMI and rhabdo, sustained fall with tear to skin on arm, now RN reports arm is erythematous, edematous, and warm, to begin IV ABX for cellulitis developing as inpatient.  Goal of Therapy:  Vancomycin trough level 10-15 mcg/ml  Plan:  Will begin vancomycin 1000mg  IV Q24H and Zosyn 3.375g IV Q8H and monitor CBC, Cx, levels prn.  Vernard Gambles, PharmD, BCPS  05/31/2013,11:59 PM

## 2013-05-31 NOTE — Progress Notes (Signed)
[facility] hospitalist progress note. Chief complaint. Left arm skin tear erythema. History of present illness. This 79 year old male in hospital for a non-ST elevated MI and rhabdomyolysis status post fall and prolonged time down on the floor. During his fall he sustained a skin tear over the left biceps area. This approximately 6 cm long. Nursing called me and stated with dressing change she found the left arm is erythematous, edematous, and with increased warmth surrounding the left biceps area skin tear. I came up to see the patient at bedside. Physical exam. Vital signs temperature 99.6, pulse 98 respiration 18, blood pressure 120/73. O2 sats 96%. General appearance. Nathan Willis elderly male who is alert and cooperative with exam. Skin. Patient has approximately 6 cm in length skin tear at the left biceps area. There is pitting edema surrounding the wound. There is erythema extending from just above the wound down to just above the wrist. The wound is draining serous material. No purulent drainage observed. Impression/plan. Problem #1. Cellulitis left arm. Reminders a.m. I suspect developing cellulitis status post skin tear upper left arm. I've asked nursing to marked the borders of the erythema to follow for any worsening/improvement. Nursing will also obtain a swab culture of the wound bed. Discussed antibiotic therapy with pharmacy and I will go with their recommendation using vancomycin and Zosyn per pharmacy consult.

## 2013-05-31 NOTE — Clinical Social Work Note (Addendum)
Clinical Social Work Department CLINICAL SOCIAL WORK PLACEMENT NOTE 05/31/2013  Patient:  Nathan Willis, Nathan Willis  Account Number:  [id number] Admit date:  05/29/2013  Clinical Social Worker:  Macario Golds, LCSW  Date/time:  05/31/2013 11:00 AM  Clinical Social Work is seeking post-discharge placement for this patient at the following level of care:   SKILLED NURSING   (*CSW will update this form in Epic as items are completed)   05/31/2013  Patient/family provided with [facility] Department of Clinical Social Work's list of facilities offering this level of care within the geographic area requested by the patient (or if unable, by the patient's family).  05/31/2013  Patient/family informed of their freedom to choose among providers that offer the needed level of care, that participate in Medicare, Medicaid or managed care program needed by the patient, have an available bed and are willing to accept the patient.  05/31/2013  Patient/family informed of [facility]' ownership interest in [facility], as well as of the fact that they are under no obligation to receive care at this facility.  PASARR submitted to [employer] on 05/31/2013 PASARR number received from [employer] on 05/31/2013  FL2 transmitted to all facilities in geographic area requested by pt/family on  05/31/2013 FL2 transmitted to all facilities within larger geographic area on   Patient informed that his/her managed care company has contracts with or will negotiate with  certain facilities, including the following:     Patient/family informed of bed offers received: 06/01/2013.  Patient chooses bed at [facility].   Patient to be transferred to [facility]  On 06/03/2013.   Patient to be transferred to facility by [facility] transport.     Additional Comments: 06/08 Patient and wife MUST go to same facility (patient wife Khiem Gargis in [id number])  06/03/2013-Pt and his wife will be going to [facility] today.  [employer] has approved the husband and wife going to the SNF.   Sherald Barge, LCSW-A Clinical Social Worker [phone]

## 2013-05-31 NOTE — Progress Notes (Signed)
[facility] PROGRESS NOTE  DELVON CHIPPS ZOX:[id number] DOB: 03-Oct-1922 DOA: 05/29/2013 PCP: [name], MD  Assessment/Plan: Non-ST elevated MI -Continue supportive care. -No interventional cardiac workup is planned for at this time. -Continue aspirin, Imdur, metoprolol. -Appreciate cardiology assistance.  Rhabdomyolysis -From prolonged stay on the floor. -Is improving nicely with IV fluids. -Check CPK in am.  Bilateral pleural effusion -Left greater than right. -Repeat chest x-ray shows that it is stable. -Patient is asymptomatic from this, no thoracentesis is planned at this time.  Fall/deconditioning -PT/OT evaluations are pending. -I suspect that skilled nursing facility may be the best venue for him at this time.  Code Status: DO NOT RESUSCITATE Family Communication: None today  Disposition Plan: SNF likely   Consultants:  Cardiology, Dr. Anne Fu    Antibiotics:  None   Subjective: Lying in bed, no complaints, no family present.  Objective: Filed Vitals:   05/30/13 1059 05/30/13 1300 05/30/13 2200 05/31/13 1048  BP: 120/70 100/69 120/89 120/80  Pulse: 80 83 87 78  Temp:  98.6 F (37 C) 98.1 F (36.7 C)   TempSrc:  Oral Oral   Resp:   18   Height:      Weight:      SpO2:  96% 98%     Intake/Output Summary (Last 24 hours) at 05/31/13 1125 Last data filed at 05/31/13 0900  Gross per 24 hour  Intake    120 ml  Output    250 ml  Net   -130 ml   Filed Weights   05/29/13 1830 05/30/13 0435  Weight: 83.9 kg (184 lb 15.5 oz) 85.73 kg (189 lb)    Exam:   General:  Awake, oriented to person and place  Cardiovascular: Regular rate and rhythm, no murmurs rubs or gallops  Respiratory: Clear to auscultation bilaterally  Abdomen: Soft, nontender, nondistended, positive bowel sounds  Extremities: Trace bilateral pitting edema   Neurologic:  Nonfocal, I have not ambulated him  Data Reviewed: Basic Metabolic Panel:  Recent  Labs Lab 05/29/13 1350 05/30/13 0125 05/31/13 0433  NA 141 139 138  K 4.2 3.6 4.2  CL 105 109 107  CO2 21 23 25   GLUCOSE 218* 154* 141*  BUN 25* 26* 25*  CREATININE 1.45* 1.37* 1.35  CALCIUM 9.1 8.4 8.8   Liver Function Tests:  Recent Labs Lab 05/29/13 1350  AST 65*  ALT 28  ALKPHOS 73  BILITOT 1.0  PROT 6.5  ALBUMIN 3.4*   No results found for this basename: LIPASE, AMYLASE,  in the last 168 hours No results found for this basename: AMMONIA,  in the last 168 hours CBC:  Recent Labs Lab 05/29/13 1350 05/30/13 0125 05/31/13 0433  WBC 13.6* 17.9* 13.4*  NEUTROABS 12.6*  --   --   HGB 14.1 12.5* 12.7*  HCT 41.8 36.6* 39.0  MCV 91.5 90.6 93.1  PLT 283 239 236   Cardiac Enzymes:  Recent Labs Lab 05/29/13 1350  05/29/13 2016 05/30/13 0125 05/30/13 1210 05/30/13 1807 05/30/13 2230  CKTOTAL 3115*  --   --  2738*  --   --   --   TROPONINI  --   < > 1.84* 1.69* 0.73* 0.82* 0.82*  < > = values in this interval not displayed. BNP (last 3 results) No results found for this basename: PROBNP,  in the last 8760 hours CBG: No results found for this basename: GLUCAP,  in the last 168 hours  No results found for this or any previous visit (  from the past 240 hour(s)).   Studies: Ct Head Wo Contrast  05/29/2013   *RADIOLOGY REPORT*  Clinical Data:  Found down  CT HEAD WITHOUT CONTRAST CT CERVICAL SPINE WITHOUT CONTRAST  Technique:  Multidetector CT imaging of the head and cervical spine was performed following the standard protocol without intravenous contrast.  Multiplanar CT image reconstructions of the cervical spine were also generated.  Comparison:  Head CT from 02/28/2011  CT HEAD  Findings: There is no evidence for acute hemorrhage, hydrocephalus, mass lesion, or abnormal extra-axial fluid collection.  No definite CT evidence for acute infarction.  Diffuse loss of parenchymal volume is consistent with atrophy.  Focal nonacute infarct in the posterior left frontal lobe  is new in the interval since the prior study. Patchy low attenuation in the deep hemispheric and periventricular white matter is nonspecific, but likely reflects chronic microvascular ischemic demyelination.  The visualized paranasal sinuses and mastoid air cells are clear.  IMPRESSION: No acute intracranial abnormality.  Atrophy with chronic small vessel white matter ischemic demyelination.  Nonacute infarct involving the posterior left frontal lobe.  CT CERVICAL SPINE  Findings: Imaging was obtained from the skull base to the C7-T1 interspace.  No evidence for fracture.  There is loss of intervertebral disc space from C3-4 down to C6-7 with associated endplate degeneration.  Diffuse facet osteoarthritis is also noted bilaterally, but most prominent at CT 3N C3-4 on the right.  No evidence for prevertebral soft tissue swelling.  Partial imaging of the left apex reveals a pleural effusion.  IMPRESSION: Diffuse degenerative changes in the cervical spine without evidence for an acute fracture.  Left pleural effusion.   Original Report Authenticated By: Kennith Center, M.D.   Ct Cervical Spine Wo Contrast  05/29/2013   *RADIOLOGY REPORT*  Clinical Data:  Found down  CT HEAD WITHOUT CONTRAST CT CERVICAL SPINE WITHOUT CONTRAST  Technique:  Multidetector CT imaging of the head and cervical spine was performed following the standard protocol without intravenous contrast.  Multiplanar CT image reconstructions of the cervical spine were also generated.  Comparison:  Head CT from 02/28/2011  CT HEAD  Findings: There is no evidence for acute hemorrhage, hydrocephalus, mass lesion, or abnormal extra-axial fluid collection.  No definite CT evidence for acute infarction.  Diffuse loss of parenchymal volume is consistent with atrophy.  Focal nonacute infarct in the posterior left frontal lobe is new in the interval since the prior study. Patchy low attenuation in the deep hemispheric and periventricular white matter is  nonspecific, but likely reflects chronic microvascular ischemic demyelination.  The visualized paranasal sinuses and mastoid air cells are clear.  IMPRESSION: No acute intracranial abnormality.  Atrophy with chronic small vessel white matter ischemic demyelination.  Nonacute infarct involving the posterior left frontal lobe.  CT CERVICAL SPINE  Findings: Imaging was obtained from the skull base to the C7-T1 interspace.  No evidence for fracture.  There is loss of intervertebral disc space from C3-4 down to C6-7 with associated endplate degeneration.  Diffuse facet osteoarthritis is also noted bilaterally, but most prominent at CT 3N C3-4 on the right.  No evidence for prevertebral soft tissue swelling.  Partial imaging of the left apex reveals a pleural effusion.  IMPRESSION: Diffuse degenerative changes in the cervical spine without evidence for an acute fracture.  Left pleural effusion.   Original Report Authenticated By: Kennith Center, M.D.   Dg Chest Port 1 View  05/30/2013   *RADIOLOGY REPORT*  Clinical Data: Left pleural effusion  PORTABLE  CHEST - 1 VIEW  Comparison: Prior chest x-ray 05/29/2013  Findings: Stable moderately large left pleural effusion with associated left basilar opacity.  Additionally, there is a layering right pleural effusion with basilar atelectasis.  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  Mild pulmonary vascular congestion persists.  Aortic atherosclerosis.  IMPRESSION:  1.  Stable moderate left and smaller right layering pleural effusions with associated bibasilar atelectasis.  2.  Stable cardiomegaly and pulmonary vascular congestion   Original Report Authenticated By: Malachy Moan, M.D.   Dg Chest Port 1 View  05/29/2013   *RADIOLOGY REPORT*  Clinical Data: Chest pain  PORTABLE CHEST - 1 VIEW  Comparison: 12/22/2012  Findings: Low volume rotated film. The cardiopericardial silhouette is enlarged.  There  is collapse / consolidation at the left base with a small left pleural effusion.  The patient is status post median sternotomy with multiple fractured mediastinal wires. Telemetry leads overlie the chest.  IMPRESSION: Cardiomegaly with left base collapse / consolidation and small left pleural effusion.   Original Report Authenticated By: Kennith Center, M.D.   Dg Shoulder Left  05/29/2013   *RADIOLOGY REPORT*  Clinical Data: Left shoulder and hand abrasions after a fall.  LEFT SHOULDER - 2+ VIEW  Comparison: No priors.  Findings: Three views of the left shoulder demonstrate no acute displaced fracture, subluxation, dislocation, joint or soft tissue abnormality.  IMPRESSION: 1.  No acute radiographic abnormality of the left shoulder.   Original Report Authenticated By: Trudie Reed, M.D.   Dg Hand Complete Left  05/29/2013   *RADIOLOGY REPORT*  Clinical Data: Fall  LEFT HAND - COMPLETE 3+ VIEW  Comparison: None.  Findings: Three views of the left hand submitted.  No acute fracture or subluxation.  Mild narrowing of radiocarpal joint space.  IMPRESSION: No acute fracture or subluxation.   Original Report Authenticated By: Natasha Mead, M.D.    Scheduled Meds: . aspirin EC  325 mg Oral Daily  . clopidogrel  75 mg Oral Daily  . docusate sodium  100 mg Oral BID  . enoxaparin (LOVENOX) injection  40 mg Subcutaneous Q24H  . finasteride  5 mg Oral Daily  . isosorbide mononitrate  120 mg Oral Daily  . metoprolol succinate  25 mg Oral Daily  . sodium chloride  3 mL Intravenous Q12H   Continuous Infusions:   Principal Problem:   NSTEMI (non-ST elevated myocardial infarction) Active Problems:   Coronary artery disease   Hypertension   CVA (cerebral infarction)   BPH (benign prostatic hyperplasia)   Rhabdomyolysis   Systolic and diastolic CHF, chronic   ARF (acute renal failure)   Idiopathic polyneuropathy   Pleural effusion on left    Time spent: 35 minutes    [name],[name]  [facility] Pager [phone]  If 7PM-7AM, please contact night-coverage at [email or web address], password [email or web address] 05/31/2013, 11:25 AM  LOS: 2 days

## 2013-05-31 NOTE — Clinical Social Work Note (Signed)
Clinical Social Work Department BRIEF PSYCHOSOCIAL ASSESSMENT 05/31/2013  Patient:  Nathan Willis, Nathan Willis     Account Number:  [id number]     Admit date:  05/29/2013  Clinical Social Worker:  Verl Blalock  Date/Time:  05/31/2013 11:00 AM  Referred by:  Physician  Date Referred:  05/31/2013 Referred for  Psychosocial assessment   Other Referral:   Interview type:  Patient Other interview type:   Patient son in waiting area    PSYCHOSOCIAL DATA Living Status:  WIFE Admitted from facility:   Level of care:   Primary support name:  Nathan Willis, Nathan Willis  [phone] Primary support relationship to patient:  CHILD, ADULT Degree of support available:   Strong    CURRENT CONCERNS Current Concerns  Post-Acute Placement   Other Concerns:    SOCIAL WORK ASSESSMENT / PLAN Clinical Social Worker met with patient at bedside to offer support and discuss patient plans at discharge.  Patient states that he lives at home and cares for his wife. Patient is agreeable to initiate search in [place] [facility] but is not happy at the idea of rehab placement at discharge.  CSW explained to patient son that we would submit tomorrow for insurance authorization for patient and patient wife [phone]) however there is the possibility of a denial and alternative plans may need to be made.  Patient son states that as long as patient remains agreeable with placement, they will work out the financial needs if insurance denies.  CSW has initiated referral and will follow up with patient and patient son regarding bed offers.  CSW remains available for support and to facilitate patient discharge needs once medically ready.   Assessment/plan status:  Psychosocial Support/Ongoing Assessment of Needs Other assessment/ plan:   Information/referral to community resources:   Visual merchandiser provided patient son with all necessary information and facts for placement needs.    PATIENT'S/FAMILY'S RESPONSE TO PLAN OF  CARE: Patient alert and oriented but very sleepy during assessment.  Patient is agreeable to SNF search in [facility] but has expressed serious hesitancy regarding actual placement.  Patient son states that patient and patient wife have been living in an unsafe environment where they can't care for each other for many months.  Patient son expresses true concern if patient declines placement. Patient and patient wife will need to be placed together if SNF fully pursued.  Patient son would be open to evaluating patient ability to making appropriate medical decisions for himself and his wife.  Patient and patient son verbalized their appreciation for CSW support and concern.     Macario Golds, [place] [phone]   (Weekend Coverage Only)

## 2013-05-31 NOTE — Progress Notes (Signed)
Echocardiogram 2D Echocardiogram has been performed.  Autumm Hattery 05/31/2013, 12:39 PM

## 2013-06-01 DIAGNOSIS — IMO0002 Reserved for concepts with insufficient information to code with codable children: Secondary | ICD-10-CM

## 2013-06-01 DIAGNOSIS — L03114 Cellulitis of left upper limb: Secondary | ICD-10-CM

## 2013-06-01 DIAGNOSIS — R7989 Other specified abnormal findings of blood chemistry: Secondary | ICD-10-CM

## 2013-06-01 LAB — BASIC METABOLIC PANEL
CO2: 26 mEq/L (ref 19–32)
GFR calc non Af Amer: 47 mL/min — ABNORMAL LOW (ref >90)
Glucose, Bld: 132 mg/dL — ABNORMAL HIGH (ref 70–99)
Potassium: 3.8 mEq/L (ref 3.5–5.1)
Sodium: 140 mEq/L (ref 135–145)

## 2013-06-01 LAB — CBC
Hemoglobin: 12.8 g/dL — ABNORMAL LOW (ref 13.0–17.0)
MCH: 30.8 pg (ref 26.0–34.0)
RBC: 4.16 MIL/uL — ABNORMAL LOW (ref 4.22–5.81)
WBC: 10.5 10*3/uL (ref 4.0–10.5)

## 2013-06-01 LAB — CK: Total CK: 344 U/L — ABNORMAL HIGH (ref 7–232)

## 2013-06-01 MED ORDER — SODIUM CHLORIDE 0.9 % IV SOLN
INTRAVENOUS | Status: DC
  Administered 2013-06-01: 23:00:00 via INTRAVENOUS

## 2013-06-01 NOTE — Progress Notes (Signed)
[facility] PROGRESS NOTE  Nathan Willis QIH:[id number] DOB: 09/14/1922 DOA: 05/29/2013 PCP: [name], MD  Assessment/Plan: Non-ST elevated MI -Continue supportive care. -No interventional cardiac workup is planned for at this time. -Continue aspirin, Imdur, metoprolol. -Appreciate cardiology assistance.  Chronic Combined CHF -Does not appear active at this time. -Would hesitate adding an ACE-I given his degree of CKD.  CKD Stage III -At baseline.  Rhabdomyolysis -Resolved. -CPK down to 344 today.  Left Arm Cellulitis -Has a skin tear at in inside of left upper arm with copious amounts of serous drainage and surrounding cellulitis. -Vanc/Zosyn for now. -Can transition to a PO regimen once SNF is ready.  Bilateral pleural effusion -Left greater than right. -Repeat chest x-ray shows that it is stable. -Patient is asymptomatic from this, no thoracentesis is planned at this time.  Fall/deconditioning -For SNF once available.  Code Status: DO NOT RESUSCITATE Family Communication: None today  Disposition Plan: SNF.   Consultants:  Cardiology, Dr. Anne Fu    Antibiotics:  Vancomycin day 1  Zosyn day 1  Subjective: Lying in bed, no complaints, no family present. Left arm does not hurt.  Objective: Filed Vitals:   05/31/13 1335 05/31/13 2100 06/01/13 0500 06/01/13 0957  BP: 132/90 120/73 131/86 116/72  Pulse: 96 98 79 96  Temp: 98.3 F (36.8 C) 99.6 F (37.6 C) 97.3 F (36.3 C)   TempSrc: Oral     Resp: 20 18 18    Height:      Weight:   86.41 kg (190 lb 8 oz)   SpO2: 97% 96% 100%     Intake/Output Summary (Last 24 hours) at 06/01/13 1122 Last data filed at 06/01/13 0900  Gross per 24 hour  Intake    120 ml  Output    500 ml  Net   -380 ml   Filed Weights   05/29/13 1830 05/30/13 0435 06/01/13 0500  Weight: 83.9 kg (184 lb 15.5 oz) 85.73 kg (189 lb) 86.41 kg (190 lb 8 oz)    Exam:   General:  Awake, oriented to person and  place  Cardiovascular: Regular rate and rhythm, no murmurs rubs or gallops  Respiratory: Clear to auscultation bilaterally  Abdomen: Soft, nontender, nondistended, positive bowel sounds  Extremities: 2+ pitting edema bilaterally.  Neurologic:  Nonfocal, I have not ambulated him  Data Reviewed: Basic Metabolic Panel:  Recent Labs Lab 05/29/13 1350 05/30/13 0125 05/31/13 0433 06/01/13 0720  NA 141 139 138 140  K 4.2 3.6 4.2 3.8  CL 105 109 107 107  CO2 21 23 25 26   GLUCOSE 218* 154* 141* 132*  BUN 25* 26* 25* 26*  CREATININE 1.45* 1.37* 1.35 1.31  CALCIUM 9.1 8.4 8.8 7.9*   Liver Function Tests:  Recent Labs Lab 05/29/13 1350  AST 65*  ALT 28  ALKPHOS 73  BILITOT 1.0  PROT 6.5  ALBUMIN 3.4*   No results found for this basename: LIPASE, AMYLASE,  in the last 168 hours No results found for this basename: AMMONIA,  in the last 168 hours CBC:  Recent Labs Lab 05/29/13 1350 05/30/13 0125 05/31/13 0433 06/01/13 0720  WBC 13.6* 17.9* 13.4* 10.5  NEUTROABS 12.6*  --   --   --   HGB 14.1 12.5* 12.7* 12.8*  HCT 41.8 36.6* 39.0 38.4*  MCV 91.5 90.6 93.1 92.3  PLT 283 239 236 221   Cardiac Enzymes:  Recent Labs Lab 05/29/13 1350  05/29/13 2016 05/30/13 0125 05/30/13 1210 05/30/13 1807 05/30/13 2230 06/01/13  0720  CKTOTAL 3115*  --   --  2738*  --   --   --  344*  TROPONINI  --   < > 1.84* 1.69* 0.73* 0.82* 0.82*  --   < > = values in this interval not displayed. BNP (last 3 results) No results found for this basename: PROBNP,  in the last 8760 hours CBG: No results found for this basename: GLUCAP,  in the last 168 hours  No results found for this or any previous visit (from the past 240 hour(s)).   Studies: No results found.  Scheduled Meds: . aspirin EC  325 mg Oral Daily  . clopidogrel  75 mg Oral Daily  . docusate sodium  100 mg Oral BID  . enoxaparin (LOVENOX) injection  40 mg Subcutaneous Q24H  . finasteride  5 mg Oral Daily  .  isosorbide mononitrate  120 mg Oral Daily  . metoprolol succinate  25 mg Oral Daily  . piperacillin-tazobactam (ZOSYN)  IV  3.375 g Intravenous Q8H  . sodium chloride  3 mL Intravenous Q12H  . vancomycin  1,000 mg Intravenous Q24H   Continuous Infusions:   Principal Problem:   NSTEMI (non-ST elevated myocardial infarction) Active Problems:   Coronary artery disease   Hypertension   CVA (cerebral infarction)   BPH (benign prostatic hyperplasia)   Rhabdomyolysis   Systolic and diastolic CHF, chronic   ARF (acute renal failure)   Idiopathic polyneuropathy   Pleural effusion on left   Cellulitis of left arm   CKD (chronic kidney disease) stage 3, GFR 30-59 ml/min    Time spent: 35 minutes    [name],[name]  [facility] Pager [phone]  If 7PM-7AM, please contact night-coverage at [email or web address], password [facility] 06/01/2013, 11:22 AM  LOS: 3 days

## 2013-06-01 NOTE — Evaluation (Signed)
Occupational Therapy Evaluation Patient Details Name: Nathan Willis MRN: [id number] DOB: 11/15/1922 Today's Date: 06/01/2013 Time: 4696-2952 OT Time Calculation (min): 15 min  OT Assessment / Plan / Recommendation Clinical Impression  Pt admitted with NSTEMI, AKI, confusion and fall.  Wife was also admitted, pt reports she fell on top of him.  Pt presents requiring assist for all mobility and ADL except self feeding and continued confusion.  Will need SNF preferably with his wife upon d/c.  Will follow.   OT Assessment  Patient needs continued OT Services    Follow Up Recommendations  SNF    Barriers to Discharge Decreased caregiver support    Equipment Recommendations       Recommendations for Other Services    Frequency  Min 2X/week    Precautions / Restrictions Precautions Precautions: Fall Restrictions Weight Bearing Restrictions: No   Pertinent Vitals/Pain Denies pain    ADL  Eating/Feeding: Set up Where Assessed - Eating/Feeding: Bed level Grooming: Wash/dry hands;Wash/dry face;Brushing hair;Minimal assistance Where Assessed - Grooming: Supine, head of bed up Upper Body Bathing: Moderate assistance Where Assessed - Upper Body Bathing: Unsupported sitting Lower Body Bathing: +1 Total assistance Where Assessed - Lower Body Bathing: Rolling right and/or left Upper Body Dressing: Minimal assistance Where Assessed - Upper Body Dressing: Unsupported sitting Lower Body Dressing: +1 Total assistance Where Assessed - Lower Body Dressing: Rolling right and/or left    OT Diagnosis: Generalized weakness;Cognitive deficits  OT Problem List: Decreased strength;Decreased activity tolerance;Impaired balance (sitting and/or standing);Decreased coordination;Decreased cognition;Decreased safety awareness;Decreased knowledge of use of DME or AE;Increased edema OT Treatment Interventions: Self-care/ADL training;DME and/or AE instruction;Therapeutic activities;Patient/family  education;Cognitive remediation/compensation;Balance training   OT Goals Acute Rehab OT Goals OT Goal Formulation: With patient Time For Goal Achievement: 06/15/13 Potential to Achieve Goals: Good ADL Goals Pt Will Perform Grooming: Sitting, edge of bed;with supervision ADL Goal: Grooming - Progress: Goal set today Pt Will Perform Upper Body Bathing: with min assist;Sitting, edge of bed ADL Goal: Upper Body Bathing - Progress: Goal set today Pt Will Perform Upper Body Dressing: with supervision;Sitting, bed ADL Goal: Upper Body Dressing - Progress: Goal set today Pt Will Transfer to Toilet: with mod assist;Stand pivot transfer;3-in-1;with DME ADL Goal: Toilet Transfer - Progress: Goal set today Miscellaneous OT Goals Miscellaneous OT Goal #1: Pt will perform supine to sit at EOB with supervision in prep for ADL. OT Goal: Miscellaneous Goal #1 - Progress: Goal set today  Visit Information  Last OT Received On: 06/01/13 Assistance Needed: +2    Subjective Data  Subjective: "My wife is here too, she fell on top of me." Patient Stated Goal: Return home.   Prior Functioning     Home Living Lives With: Spouse Available Help at Discharge: Skilled Nursing Facility Prior Function Level of Independence: Independent Driving: Yes Vocation: Retired Musician: Expressive difficulties (voulme decreases periodically) Dominant Hand: Right         Vision/Perception Vision - History Patient Visual Report: No change from baseline   Cognition  Cognition Arousal/Alertness: Awake/alert Behavior During Therapy: WFL for tasks assessed/performed Overall Cognitive Status: Impaired/Different from baseline Area of Impairment: Orientation;Memory;Safety/judgement;Problem solving Orientation Level: Disoriented to;Time Memory: Decreased short-term memory Following Commands: Follows one step commands inconsistently;Follows one step commands with increased  time Safety/Judgement: Decreased awareness of safety;Decreased awareness of deficits Problem Solving: Decreased initiation;Slow processing    Extremity/Trunk Assessment Right Upper Extremity Assessment RUE ROM/Strength/Tone: Deficits RUE ROM/Strength/Tone Deficits: generalized weakness RUE Coordination: WFL - gross/fine motor Left Upper Extremity Assessment  LUE ROM/Strength/Tone: Deficits LUE ROM/Strength/Tone Deficits: generalized weakness, pt with moderate edema/cellulitis LUE Coordination: WFL - gross/fine motor     Mobility Bed Mobility Bed Mobility: Supine to Sit;Sitting - Scoot to [place] of Bed;Sit to Supine;Scooting to [facility] Supine to Sit: 1: +1 Total assist;HOB flat Sitting - Scoot to Edge of Bed: 3: Mod assist;With rail Sit to Supine: 1: +1 Total assist;HOB flat Details for Bed Mobility Assistance: verbal and tactile cues for technique Transfers Transfers: Not assessed (did not attempt in absence of second person)     Exercise     Balance Balance Balance Assessed: Yes Static Sitting Balance Static Sitting - Balance Support: Bilateral upper extremity supported;Feet supported Static Sitting - Level of Assistance: 5: Stand by assistance   End of Session OT - End of Session Activity Tolerance: Patient tolerated treatment well Patient left: in bed;with call bell/phone within reach;with family/visitor present  GO     Evern Bio 06/01/2013, 1:34 PM [phone]

## 2013-06-01 NOTE — Progress Notes (Signed)
Centralized monitor alarm at this time.  Rhythm showed 2 unifocal PVCs which occur rarely.  Patient sleeping soundly.  Vitals stable.  Rhythm resumed to normal rhythm of NSR with PACs.  No other concerns at this time.  Will continue to monitor.  Barrie Lyme RN II 10:13 PM 06/01/2013

## 2013-06-01 NOTE — Progress Notes (Signed)
Utilization review completed.  

## 2013-06-01 NOTE — Progress Notes (Signed)
Physical Therapy Treatment Patient Details Name: HAZIM TREADWAY MRN: [id number] DOB: [birth year] Today's Date: 06/01/2013 Time: 4098-1191 PT Time Calculation (min): 24 min  PT Assessment / Plan / Recommendation Comments on Treatment Session  Pt s/p fall, NSTEMI and confusion.  Will need NHP secondary to deconditioned.      Follow Up Recommendations  SNF                 Equipment Recommendations  None recommended by PT        Frequency Min 2X/week   Plan Discharge plan remains appropriate;Frequency remains appropriate    Precautions / Restrictions Precautions Precautions: Fall Restrictions Weight Bearing Restrictions: No   Pertinent Vitals/Pain VSS, no pain    Mobility  Bed Mobility Bed Mobility: Supine to Sit;Sitting - Scoot to [place] of Bed;Sit to Supine;Scooting to [facility] Supine to Sit: HOB flat;3: Mod assist Sitting - Scoot to Edge of Bed: 3: Mod assist;With rail Sit to Supine: Not Tested (comment) Scooting to [facility]: Not tested (comment) Details for Bed Mobility Assistance: verbal and tactile cues for technique.  Leaning to left and posterior initially.  Transfers Transfers: Sit to Stand;Stand to Sit;Stand Pivot Transfers Sit to Stand: 3: Mod assist;From elevated surface;With upper extremity assist;From bed Stand to Sit: 2: Max assist;With upper extremity assist;With armrests;To chair/3-in-1 Stand Pivot Transfers: 3: Mod assist Details for Transfer Assistance: Verbal cues for hand placement.  Elevated pt and patient able to rise to standing with mod assist.  Used RW and was able to stand and pivot.  Not standing fully upright.  Significant posterior lean as well as left lateral lean.  Needed assist to control descent into chair.   Ambulation/Gait Ambulation/Gait Assistance: Not tested (comment) Stairs: No Wheelchair Mobility Wheelchair Mobility: No    Exercises General Exercises - Lower Extremity Ankle Circles/Pumps: AROM;Both;10 reps;Supine Quad Sets: AROM;Both;10  reps;Supine Long Arc Quad: AROM;Both;10 reps;Seated Hip Flexion/Marching: AROM;Both;10 reps;Seated    PT Goals Acute Rehab PT Goals Pt will go Supine/Side to Sit: with min assist;with HOB 0 degrees PT Goal: Supine/Side to Sit - Progress: Progressing toward goal Pt will go Sit to Stand: with min assist;with upper extremity assist PT Goal: Sit to Stand - Progress: Progressing toward goal Pt will Transfer Bed to Chair/Chair to Bed: with min assist PT Transfer Goal: Bed to Chair/Chair to Bed - Progress: Progressing toward goal  Visit Information  Last PT Received On: 06/01/13 Assistance Needed: +2 (for ambulation)    Subjective Data  Subjective: "I want to get up."   Cognition  Cognition Arousal/Alertness: Awake/alert Behavior During Therapy: WFL for tasks assessed/performed Overall Cognitive Status: Impaired/Different from baseline Area of Impairment: Orientation;Memory;Safety/judgement;Problem solving Orientation Level: Disoriented to;Time Memory: Decreased short-term memory Following Commands: Follows one step commands inconsistently;Follows one step commands with increased time Safety/Judgement: Decreased awareness of safety;Decreased awareness of deficits Problem Solving: Decreased initiation;Slow processing    Balance  Balance Balance Assessed: Yes Static Sitting Balance Static Sitting - Balance Support: Bilateral upper extremity supported;Feet supported Static Sitting - Level of Assistance: 5: Stand by assistance Static Sitting - Comment/# of Minutes: 2 Static Standing Balance Static Standing - Balance Support: Bilateral upper extremity supported;During functional activity Static Standing - Level of Assistance: 3: Mod assist Static Standing - Comment/# of Minutes: 3 minutes.  Pt with posterior lean and not fully upright stance.    End of Session PT - End of Session Equipment Utilized During Treatment: Gait belt Activity Tolerance: Patient limited by fatigue Patient  left: with call bell/phone within  reach;in chair Nurse Communication: Mobility status;Need for lift equipment        INGOLD,Earsie Humm 06/01/2013, 4:06 PM [facility] Acute Rehabilitation [phone] (pager)

## 2013-06-01 NOTE — Consult Note (Signed)
WOC consult Note Reason for Consult: skin tear to the left inner upper arm sustained during recent fall.  Wound type:skin tear Measurement: 3.0cm x 4.0cm x 0.2cm  Wound VWU:JWJX flap in place, partial thickness Drainage (amount, consistency, odor) heavy amounts of serous drainage. The patient's arm is every edematous. Periwound: ecchymosis over the entire arm Dressing procedure/placement/frequency: add silver Hydrofiber for antimicrobial and extra absorbency, under foam. Discussed with pt and bedside nurse, she will place when the dressing arrives to the floor.  Re consult if needed, will not follow at this time. Thanks  Nesbit Michon [employer], CWOCN [phone])

## 2013-06-01 NOTE — Progress Notes (Signed)
Patient ID: Virgel Gess, male   DOB: October 01, 1922, 79 y.o.   MRN: [id number] Patient Name: Nathan Willis Date of Encounter: 06/01/2013    SUBJECTIVE:  No cardiac complaints.  TELEMETRY:  NSR with PAC's: Filed Vitals:   05/31/13 1048 05/31/13 1335 05/31/13 2100 06/01/13 0500  BP: 120/80 132/90 120/73 131/86  Pulse: 78 96 98 79  Temp:  98.3 F (36.8 C) 99.6 F (37.6 C) 97.3 F (36.3 C)  TempSrc:  Oral    Resp:  20 18 18   Height:      Weight:    86.41 kg (190 lb 8 oz)  SpO2:  97% 96% 100%    Intake/Output Summary (Last 24 hours) at 06/01/13 0852 Last data filed at 05/31/13 2100  Gross per 24 hour  Intake      0 ml  Output    500 ml  Net   -500 ml    LABS: Basic Metabolic Panel:  Recent Labs  40/98/11 0433 06/01/13 0720  NA 138 140  K 4.2 3.8  CL 107 107  CO2 25 26  GLUCOSE 141* 132*  BUN 25* 26*  CREATININE 1.35 1.31  CALCIUM 8.8 7.9*   CBC:  Recent Labs  05/29/13 1350  05/31/13 0433 06/01/13 0720  WBC 13.6*  < > 13.4* 10.5  NEUTROABS 12.6*  --   --   --   HGB 14.1  < > 12.7* 12.8*  HCT 41.8  < > 39.0 38.4*  MCV 91.5  < > 93.1 92.3  PLT 283  < > 236 221  < > = values in this interval not displayed. Cardiac Enzymes:  Recent Labs  05/29/13 1350  05/30/13 0125 05/30/13 1210 05/30/13 1807 05/30/13 2230 06/01/13 0720  CKTOTAL 3115*  --  9147*  --   --   --  344*  TROPONINI  --   < > 1.69* 0.73* 0.82* 0.82*  --   < > = values in this interval not displayed. BNP: No components found with this basename: POCBNP,  Hemoglobin A1C:  Recent Labs  05/29/13 2016  HGBA1C 5.8*   ECHO: Study Conclusions  - Left ventricle: The cavity size was mildly dilated. There was mild concentric hypertrophy. Systolic function was moderately to severely reduced. The estimated ejection fraction was in the range of 30% to 35%. Diffuse hypokinesis. - Mitral valve: Mild regurgitation. - Left atrium: The atrium was mildly dilated. - Right atrium: The atrium was  mildly dilated. - Pulmonary arteries: Systolic pressure was mildly to moderately increased. PA peak pressure: 41mm Hg (S). - Pericardium, extracardiac: There was a left pleural effusion.   Radiology/Studies:  No new  Physical Exam: Blood pressure 131/86, pulse 79, temperature 97.3 F (36.3 C), temperature source Oral, resp. rate 18, height 6' (1.829 m), weight 86.41 kg (190 lb 8 oz), SpO2 100.00%. Weight change:    Sternal click. Chest clear anteriorly. No muemur ASSESSMENT:  1. CAD with no evidence of ACS 2. Chronic systolic heart failure with EF 35% 3. Cognitive impairment and frailty.   Plan:  1. Not a candidate for aggressive/invasive cardiac therapy. 2. BNP to r/o subclinical CHF (already on beta blocker/ May need to add hydralazine or start an ACE if kidney function allows).  Selinda Eon 06/01/2013, 8:52 AM

## 2013-06-01 NOTE — Progress Notes (Signed)
Notified Lenny Pastel, NP of patient's arm after dressing change. Patient's left arm was red, swollen with pitting edema and was weeping. Patient states it is painful to touch and he has a low grade fever 99.2. NP to come see patient's arm at bedside. Will continue to monitor. Earnest Conroy RN

## 2013-06-02 LAB — BASIC METABOLIC PANEL
BUN: 25 mg/dL — ABNORMAL HIGH (ref 6–23)
CO2: 24 mEq/L (ref 19–32)
Calcium: 8.4 mg/dL (ref 8.4–10.5)
Creatinine, Ser: 1.18 mg/dL (ref 0.50–1.35)
GFR calc non Af Amer: 53 mL/min — ABNORMAL LOW (ref >90)
Glucose, Bld: 134 mg/dL — ABNORMAL HIGH (ref 70–99)
Sodium: 139 mEq/L (ref 135–145)

## 2013-06-02 LAB — URINALYSIS, ROUTINE W REFLEX MICROSCOPIC
Ketones, ur: NEGATIVE mg/dL
Nitrite: NEGATIVE
Specific Gravity, Urine: 1.026 (ref 1.005–1.030)
Urobilinogen, UA: 1 mg/dL (ref 0.0–1.0)

## 2013-06-02 LAB — CBC
MCH: 30.4 pg (ref 26.0–34.0)
MCHC: 32.9 g/dL (ref 30.0–36.0)
MCV: 92.4 fL (ref 78.0–100.0)
Platelets: 251 10*3/uL (ref 150–400)
RDW: 14.3 % (ref 11.5–15.5)

## 2013-06-02 LAB — URINE MICROSCOPIC-ADD ON

## 2013-06-02 MED ORDER — ACETAMINOPHEN 325 MG PO TABS: 650.0000 mg | ORAL_TABLET | Freq: Four times a day (QID) | ORAL | Status: AC | PRN

## 2013-06-02 MED ORDER — DOXYCYCLINE HYCLATE 100 MG PO TABS: 100.0000 mg | ORAL_TABLET | Freq: Two times a day (BID) | ORAL | Status: DC

## 2013-06-02 MED ORDER — ASPIRIN 325 MG PO TBEC: 325.0000 mg | DELAYED_RELEASE_TABLET | Freq: Every day | ORAL | Status: AC

## 2013-06-02 NOTE — Progress Notes (Signed)
Pt appears to have gone into afib, rate controlled. Dr. Ardyth Harps notified and  Orders obtained for ekg. Will cont to monitor

## 2013-06-02 NOTE — Progress Notes (Signed)
Assisted patient to void with urinal several times during night.  Unable to void.  Bladder scan showed >650 ml.  Notified Donnamarie Poag NP.  Order for I&O cathX1.  840 ml amber clear urine obtained.  Patient resting comfortably.  No other concerns at this time.  Will continue to monitor.  Barrie Lyme RN II 2:33 AM 06/02/2013

## 2013-06-02 NOTE — Discharge Summary (Signed)
Physician Discharge Summary  Nathan Willis:[id number] DOB: 1922-02-15 DOA: 05/29/2013  PCP: [name], MD  Admit date: 05/29/2013 Discharge date: 06/02/2013  Time spent: Greater than 30 minutes  Recommendations for Outpatient Follow-up:  -Follow progress of arm cellulitis.   Discharge Diagnoses:  Principal Problem:   NSTEMI (non-ST elevated myocardial infarction) Active Problems:   Coronary artery disease   Hypertension   CVA (cerebral infarction)   BPH (benign prostatic hyperplasia)   Rhabdomyolysis   Systolic and diastolic CHF, chronic   ARF (acute renal failure)   Idiopathic polyneuropathy   Pleural effusion on left   Cellulitis of left arm   CKD (chronic kidney disease) stage 3, GFR 30-59 ml/min   Discharge Condition: Stable and improved.  Filed Weights   05/30/13 0435 06/01/13 0500 06/02/13 0609  Weight: 85.73 kg (189 lb) 86.41 kg (190 lb 8 oz) 86 kg (189 lb 9.5 oz)    History of present illness:  Patient is an 79 y.o. male past medical history of coronary artery disease, cognitive deficits, polyneuropathy, was brought to the emergency room after he was found down in his bathroom. He probably fell the night before admission and he was on the floor all night. His niece found him today and had to break the door to get to him. Patient has no exact recollection of the events that led to his fall - but he thinks that maybe his wife tripped him. He's not oriented to date, month, situation. Evaluation in emergency room revealed an elevated troponin level with EKG changes but the patient denies current chest pain . Further evaluation indicates also renal insufficiency and rhabdomyolysis . Patient complains of some left-sided pain. We were asked to admit him for further evaluation and management.   Hospital Course:   Non-ST elevated MI  -Continue supportive care.  -No interventional cardiac workup is planned for at this time.  -Continue aspirin, Imdur, metoprolol.   -Appreciate cardiology assistance.   Chronic Combined CHF  -Does not appear active at this time.  -Would hesitate adding an ACE-I given his degree of CKD.   CKD Stage III  -At baseline.   Rhabdomyolysis  -Resolved.  -CPK down to 344.   Left Arm Cellulitis  -Has a skin tear at in inside of left upper arm with copious amounts of serous drainage and surrounding cellulitis.  -Seen by wound care and they are recommending silver hydrofiber under a foam dressing to be applied to the skin tear on the inner part of the left upper arm. -Much improved on follow up exam today. -Has received 2 days of Vanc/Zosyn.  -Will transition over to PO doxy to complete 14 days.  Bilateral pleural effusion  -Left greater than right.  -Repeat chest x-ray shows that it is stable.  -Patient is asymptomatic from this, no thoracentesis is planned at this time.   Fall/deconditioning  -For SNF once available.   Procedures:  None   Consultations:  Cardiology, Dr. Katrinka Blazing  Discharge Instructions  Discharge Orders   Future Orders Complete By Expires     Diet - low sodium heart healthy  As directed     Discontinue IV  As directed     Increase activity slowly  As directed         Medication List    STOP taking these medications       cephALEXin 500 MG capsule  Commonly known as:  KEFLEX     furosemide 20 MG tablet  Commonly known as:  LASIX  nitroGLYCERIN 0.4 MG SL tablet  Commonly known as:  NITROSTAT      TAKE these medications       acetaminophen 325 MG tablet  Commonly known as:  TYLENOL  Take 2 tablets (650 mg total) by mouth every 6 (six) hours as needed.     aspirin 325 MG EC tablet  Take 1 tablet (325 mg total) by mouth daily.     clopidogrel 75 MG tablet  Commonly known as:  PLAVIX  Take 75 mg by mouth daily.     doxycycline 100 MG tablet  Commonly known as:  VIBRA-TABS  Take 1 tablet (100 mg total) by mouth 2 (two) times daily. For 14 days     finasteride 5 MG  tablet  Commonly known as:  PROSCAR  Take 5 mg by mouth daily.     isosorbide mononitrate 120 MG 24 hr tablet  Commonly known as:  IMDUR  Take 120 mg by mouth daily.     metoprolol succinate 25 MG 24 hr tablet  Commonly known as:  TOPROL-XL  Take 25 mg by mouth daily.     pravastatin 20 MG tablet  Commonly known as:  PRAVACHOL  Take 20 mg by mouth daily.       Allergies  Allergen Reactions  . Baycol (Cerivastatin) Other (See Comments)    REACTION:  unknown  . Codeine Other (See Comments)    REACTION:  "makes me crazy"  . Lescol (Fluvastatin Sodium) Other (See Comments)    REACTION:  unknown  . Lipitor (Atorvastatin) Other (See Comments)    REACTION:  "makes me violent"  . Morphine And Related Other (See Comments)    REACTION:  "makes me crazy"  . Zetia (Ezetimibe) Other (See Comments)    REACTION:  unknown  . Zocor (Simvastatin) Other (See Comments)    REACTION:  unknown      The results of significant diagnostics from this hospitalization (including imaging, microbiology, ancillary and laboratory) are listed below for reference.    Significant Diagnostic Studies: Ct Head Wo Contrast  05/29/2013   *RADIOLOGY REPORT*  Clinical Data:  Found down  CT HEAD WITHOUT CONTRAST CT CERVICAL SPINE WITHOUT CONTRAST  Technique:  Multidetector CT imaging of the head and cervical spine was performed following the standard protocol without intravenous contrast.  Multiplanar CT image reconstructions of the cervical spine were also generated.  Comparison:  Head CT from 02/28/2011  CT HEAD  Findings: There is no evidence for acute hemorrhage, hydrocephalus, mass lesion, or abnormal extra-axial fluid collection.  No definite CT evidence for acute infarction.  Diffuse loss of parenchymal volume is consistent with atrophy.  Focal nonacute infarct in the posterior left frontal lobe is new in the interval since the prior study. Patchy low attenuation in the deep hemispheric and periventricular white  matter is nonspecific, but likely reflects chronic microvascular ischemic demyelination.  The visualized paranasal sinuses and mastoid air cells are clear.  IMPRESSION: No acute intracranial abnormality.  Atrophy with chronic small vessel white matter ischemic demyelination.  Nonacute infarct involving the posterior left frontal lobe.  CT CERVICAL SPINE  Findings: Imaging was obtained from the skull base to the C7-T1 interspace.  No evidence for fracture.  There is loss of intervertebral disc space from C3-4 down to C6-7 with associated endplate degeneration.  Diffuse facet osteoarthritis is also noted bilaterally, but most prominent at CT 3N C3-4 on the right.  No evidence for prevertebral soft tissue swelling.  Partial imaging of the left  apex reveals a pleural effusion.  IMPRESSION: Diffuse degenerative changes in the cervical spine without evidence for an acute fracture.  Left pleural effusion.   Original Report Authenticated By: Kennith Center, M.D.   Ct Cervical Spine Wo Contrast  05/29/2013   *RADIOLOGY REPORT*  Clinical Data:  Found down  CT HEAD WITHOUT CONTRAST CT CERVICAL SPINE WITHOUT CONTRAST  Technique:  Multidetector CT imaging of the head and cervical spine was performed following the standard protocol without intravenous contrast.  Multiplanar CT image reconstructions of the cervical spine were also generated.  Comparison:  Head CT from 02/28/2011  CT HEAD  Findings: There is no evidence for acute hemorrhage, hydrocephalus, mass lesion, or abnormal extra-axial fluid collection.  No definite CT evidence for acute infarction.  Diffuse loss of parenchymal volume is consistent with atrophy.  Focal nonacute infarct in the posterior left frontal lobe is new in the interval since the prior study. Patchy low attenuation in the deep hemispheric and periventricular white matter is nonspecific, but likely reflects chronic microvascular ischemic demyelination.  The visualized paranasal sinuses and mastoid air  cells are clear.  IMPRESSION: No acute intracranial abnormality.  Atrophy with chronic small vessel white matter ischemic demyelination.  Nonacute infarct involving the posterior left frontal lobe.  CT CERVICAL SPINE  Findings: Imaging was obtained from the skull base to the C7-T1 interspace.  No evidence for fracture.  There is loss of intervertebral disc space from C3-4 down to C6-7 with associated endplate degeneration.  Diffuse facet osteoarthritis is also noted bilaterally, but most prominent at CT 3N C3-4 on the right.  No evidence for prevertebral soft tissue swelling.  Partial imaging of the left apex reveals a pleural effusion.  IMPRESSION: Diffuse degenerative changes in the cervical spine without evidence for an acute fracture.  Left pleural effusion.   Original Report Authenticated By: Kennith Center, M.D.   Dg Chest Port 1 View  05/30/2013   *RADIOLOGY REPORT*  Clinical Data: Left pleural effusion  PORTABLE CHEST - 1 VIEW  Comparison: Prior chest x-ray 05/29/2013  Findings: Stable moderately large left pleural effusion with associated left basilar opacity.  Additionally, there is a layering right pleural effusion with basilar atelectasis.  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  Mild pulmonary vascular congestion persists.  Aortic atherosclerosis.  IMPRESSION:  1.  Stable moderate left and smaller right layering pleural effusions with associated bibasilar atelectasis.  2.  Stable cardiomegaly and pulmonary vascular congestion   Original Report Authenticated By: Malachy Moan, M.D.   Dg Chest Port 1 View  05/29/2013   *RADIOLOGY REPORT*  Clinical Data: Chest pain  PORTABLE CHEST - 1 VIEW  Comparison: 12/22/2012  Findings: Low volume rotated film. The cardiopericardial silhouette is enlarged.  There is collapse / consolidation at the left base with a small left pleural effusion.  The patient is status post median sternotomy  with multiple fractured mediastinal wires. Telemetry leads overlie the chest.  IMPRESSION: Cardiomegaly with left base collapse / consolidation and small left pleural effusion.   Original Report Authenticated By: Kennith Center, M.D.   Dg Shoulder Left  05/29/2013   *RADIOLOGY REPORT*  Clinical Data: Left shoulder and hand abrasions after a fall.  LEFT SHOULDER - 2+ VIEW  Comparison: No priors.  Findings: Three views of the left shoulder demonstrate no acute displaced fracture, subluxation, dislocation, joint or soft tissue abnormality.  IMPRESSION: 1.  No acute radiographic abnormality of the left shoulder.   Original  Report Authenticated By: Trudie Reed, M.D.   Dg Hand Complete Left  05/29/2013   *RADIOLOGY REPORT*  Clinical Data: Fall  LEFT HAND - COMPLETE 3+ VIEW  Comparison: None.  Findings: Three views of the left hand submitted.  No acute fracture or subluxation.  Mild narrowing of radiocarpal joint space.  IMPRESSION: No acute fracture or subluxation.   Original Report Authenticated By: Natasha Mead, M.D.    Microbiology: Recent Results (from the past 240 hour(s))  WOUND CULTURE     Status: None   Collection Time    05/31/13 11:49 PM      Result Value Range Status   Specimen Description WOUND LEFT ARM   Final   Special Requests NONE   Final   Gram Stain     Final   Value: RARE WBC PRESENT, PREDOMINANTLY PMN     NO SQUAMOUS EPITHELIAL CELLS SEEN     FEW GRAM POSITIVE COCCI IN PAIRS   Culture     Final   Value: ABUNDANT STAPHYLOCOCCUS AUREUS     Note: RIFAMPIN AND GENTAMICIN SHOULD NOT BE USED AS SINGLE DRUGS FOR TREATMENT OF STAPH INFECTIONS.   Report Status PENDING   Incomplete     Labs: Basic Metabolic Panel:  Recent Labs Lab 05/29/13 1350 05/30/13 0125 05/31/13 0433 06/01/13 0720 06/02/13 0505  NA 141 139 138 140 139  K 4.2 3.6 4.2 3.8 3.5  CL 105 109 107 107 106  CO2 21 23 25 26 24   GLUCOSE 218* 154* 141* 132* 134*  BUN 25* 26* 25* 26* 25*  CREATININE 1.45* 1.37*  1.35 1.31 1.18  CALCIUM 9.1 8.4 8.8 7.9* 8.4   Liver Function Tests:  Recent Labs Lab 05/29/13 1350  AST 65*  ALT 28  ALKPHOS 73  BILITOT 1.0  PROT 6.5  ALBUMIN 3.4*   No results found for this basename: LIPASE, AMYLASE,  in the last 168 hours No results found for this basename: AMMONIA,  in the last 168 hours CBC:  Recent Labs Lab 05/29/13 1350 05/30/13 0125 05/31/13 0433 06/01/13 0720 06/02/13 0505  WBC 13.6* 17.9* 13.4* 10.5 9.9  NEUTROABS 12.6*  --   --   --   --   HGB 14.1 12.5* 12.7* 12.8* 13.2  HCT 41.8 36.6* 39.0 38.4* 40.1  MCV 91.5 90.6 93.1 92.3 92.4  PLT 283 239 236 221 251   Cardiac Enzymes:  Recent Labs Lab 05/29/13 1350  05/29/13 2016 05/30/13 0125 05/30/13 1210 05/30/13 1807 05/30/13 2230 06/01/13 0720  CKTOTAL 3115*  --   --  4782*  --   --   --  344*  TROPONINI  --   < > 1.84* 1.69* 0.73* 0.82* 0.82*  --   < > = values in this interval not displayed. BNP: BNP (last 3 results) No results found for this basename: PROBNP,  in the last 8760 hours CBG: No results found for this basename: GLUCAP,  in the last 168 hours     Signed:  Chaya Jan  [facility] Pager: [phone] 06/02/2013, 11:15 AM

## 2013-06-02 NOTE — Progress Notes (Signed)
Bladder scan done just to check for any urinary retention and scan showed 237 cc. Dr. Ardyth Harps notified and stated it was ok. Will cont to monitor

## 2013-06-02 NOTE — Progress Notes (Signed)
Physical Therapy Treatment Patient Details Name: Nathan Willis MRN: [id number] DOB: 06/24/1922 Today's Date: 06/02/2013 Time: 2725-3664 PT Time Calculation (min): 34 min  PT Assessment / Plan / Recommendation Comments on Treatment Session  Patient improving tolerance to ambulation this date and able to sequence for improved step length within session.  Still stiff, kyphotic with posterior pelvic tilt and posterior loss of balance sitting edge of bed x 1.  Will need SNF level therapies at d/c.    Follow Up Recommendations  SNF           Equipment Recommendations  None recommended by PT       Frequency Min 3X/week   Plan Discharge plan remains appropriate    Precautions / Restrictions Precautions Precautions: Fall   Pertinent Vitals/Pain No pain complaints    Mobility  Bed Mobility Supine to Sit: HOB elevated;3: Mod assist Sitting - Scoot to Edge of Bed: 4: Min assist Details for Bed Mobility Assistance: cues for technique,  Transfers Sit to Stand: 1: +2 Total assist;From bed Sit to Stand: Patient Percentage: 60% Stand to Sit: To chair/3-in-1;1: +2 Total assist Stand to Sit: Patient Percentage: 40% Details for Transfer Assistance: uncontrolled descent into chair, cues for hand placement Ambulation/Gait Ambulation/Gait Assistance: 3: Mod assist Ambulation Distance (Feet): 15 Feet Assistive device: Rolling walker Ambulation/Gait Assistance Details: cues and facilitation for step length, weight shift Gait Pattern: Shuffle;Trunk flexed;Decreased hip/knee flexion - left;Decreased dorsiflexion - right;Decreased dorsiflexion - left;Decreased hip/knee flexion - right;Step-to pattern;Decreased stride length    Exercises General Exercises - Lower Extremity Ankle Circles/Pumps: AROM;Both;20 reps;Supine Heel Slides: AAROM;Both;10 reps;Supine     PT Goals Acute Rehab PT Goals Pt will go Supine/Side to Sit: with min assist;with HOB 0 degrees PT Goal: Supine/Side to Sit -  Progress: Progressing toward goal Pt will go Sit to Stand: with min assist;with upper extremity assist PT Goal: Sit to Stand - Progress: Progressing toward goal Pt will Ambulate: 16 - 50 feet;with min assist;with rolling walker PT Goal: Ambulate - Progress: Progressing toward goal  Visit Information  Last PT Received On: 06/02/13    Subjective Data  Subjective: Where are we going?   Cognition  Cognition Arousal/Alertness: Awake/alert Behavior During Therapy: WFL for tasks assessed/performed Overall Cognitive Status: Impaired/Different from baseline Area of Impairment: Orientation;Memory;Safety/judgement;Problem solving Memory: Decreased short-term memory Following Commands: Follows one step commands with increased time Problem Solving: Decreased initiation;Slow processing    Balance  Static Standing Balance Static Standing - Balance Support: Bilateral upper extremity supported Static Standing - Level of Assistance: 3: Mod assist  End of Session PT - End of Session Equipment Utilized During Treatment: Gait belt Activity Tolerance: Patient limited by fatigue Patient left: with call bell/phone within reach;in chair;Other (comment) (wife in room in chair visiting from her hospital room) Nurse Communication: Mobility status   GP     [facility] 06/02/2013, 3:08 PM Sheran Lawless, PT [phone] 06/02/2013

## 2013-06-03 LAB — WOUND CULTURE

## 2013-06-03 NOTE — Progress Notes (Signed)
Physical Therapy Treatment Patient Details Name: Nathan Willis MRN: [id number] DOB: November 10, 1922 Today's Date: 06/03/2013 Time: 2952-8413 PT Time Calculation (min): 25 min  PT Assessment / Plan / Recommendation Comments on Treatment Session  Patient improving tolerance to ambulation this date and able to sequence for improved step length within session.  Still stiff, kyphotic with posterior pelvic tilt and posterior loss of balance upon initial stand at EOB.    Will need SNF level therapies at d/c.    Follow Up Recommendations  SNF                 Equipment Recommendations  None recommended by PT        Frequency Min 3X/week   Plan Discharge plan remains appropriate;Frequency remains appropriate    Precautions / Restrictions Precautions Precautions: Fall Restrictions Weight Bearing Restrictions: No   Pertinent Vitals/Pain VSS, No pain    Mobility  Bed Mobility Bed Mobility: Supine to Sit;Sitting - Scoot to Edge of Bed Supine to Sit: HOB elevated;3: Mod assist Sitting - Scoot to Edge of Bed: 4: Min assist Sit to Supine: Not Tested (comment) Scooting to [facility]: Not tested (comment) Details for Bed Mobility Assistance: cues for technique,  Transfers Transfers: Sit to Stand;Stand to Sit;Stand Pivot Transfers Sit to Stand: 1: +2 Total assist;From bed;With upper extremity assist Sit to Stand: Patient Percentage: 40% Stand to Sit: 1: +2 Total assist;To chair/3-in-1;With upper extremity assist Stand to Sit: Patient Percentage: 40% Details for Transfer Assistance: uncontrolled descent into chair, cues for hand placement Ambulation/Gait Ambulation/Gait Assistance: 1: +2 Total assist Ambulation/Gait: Patient Percentage: 80% Ambulation Distance (Feet): 24 Feet Assistive device: Rolling walker Ambulation/Gait Assistance Details: Max cues for step length.  Festinating gait.  Also needed assist to move Rw as well as for upright stance.   Gait Pattern: Shuffle;Trunk flexed;Decreased  hip/knee flexion - left;Decreased dorsiflexion - right;Decreased dorsiflexion - left;Decreased hip/knee flexion - right;Step-to pattern;Decreased stride length Gait velocity: decreased Stairs: No Wheelchair Mobility Wheelchair Mobility: No    Exercises General Exercises - Lower Extremity Ankle Circles/Pumps: AROM;Both;20 reps;Supine Quad Sets: AROM;Both;10 reps;Supine Long Arc Quad: AROM;Both;10 reps;Seated Heel Slides: AAROM;Both;10 reps;Supine Hip ABduction/ADduction: AROM;Both;10 reps;Supine   PT Goals Acute Rehab PT Goals Pt will go Supine/Side to Sit: with min assist;with HOB 0 degrees PT Goal: Supine/Side to Sit - Progress: Progressing toward goal Pt will go Sit to Supine/Side: with min assist;with HOB 0 degrees PT Goal: Sit to Supine/Side - Progress: Progressing toward goal Pt will go Sit to Stand: with min assist;with upper extremity assist PT Goal: Sit to Stand - Progress: Progressing toward goal Pt will Transfer Bed to Chair/Chair to Bed: with min assist PT Transfer Goal: Bed to Chair/Chair to Bed - Progress: Progressing toward goal Pt will Ambulate: 16 - 50 feet;with min assist;with rolling walker PT Goal: Ambulate - Progress: Progressing toward goal  Visit Information  Last PT Received On: 06/03/13 Assistance Needed: +2 (for amb) PT/OT Co-Evaluation/Treatment: Yes    Subjective Data  Subjective: "I want to try and make it to the door."   Cognition  Cognition Arousal/Alertness: Awake/alert Behavior During Therapy: [facility] for tasks assessed/performed Overall Cognitive Status: Impaired/Different from baseline Area of Impairment: Memory;Attention;Safety/judgement;Following commands Orientation Level: Disoriented to;Time Current Attention Level: Sustained (distracted by desire to talk to wife on phone) Memory: Decreased short-term memory Following Commands: Follows one step commands with increased time Safety/Judgement: Decreased awareness of safety Problem Solving:  Decreased initiation;Slow processing    Balance  Balance Balance Assessed: Yes Static Sitting Balance  Static Sitting - Balance Support: Bilateral upper extremity supported;Feet supported Static Sitting - Level of Assistance: 5: Stand by assistance Static Sitting - Comment/# of Minutes: 4 Static Standing Balance Static Standing - Balance Support: Bilateral upper extremity supported Static Standing - Level of Assistance: 4: Min assist Static Standing - Comment/# of Minutes: 2 minutes, continues with posterior lean and not fully upright stance.    End of Session PT - End of Session Equipment Utilized During Treatment: Gait belt Activity Tolerance: Patient limited by fatigue Patient left: with call bell/phone within reach;in chair;Other (comment) Nurse Communication: Mobility status      Willis,Nathan Cuda 06/03/2013, 11:00 AM [facility] [phone] (pager)

## 2013-06-03 NOTE — Progress Notes (Signed)
Pt discharged to [facility] per MD. Pt oriented to person at time of discharge. Pt son, Annette Stable received and reviewed all discharge instructions and medication information including follow-up appointments and prescription information.Pt son verbalized understanding. Report given to [facility] RN at time of discharge. Pt transported via ambulance to [facility]. All belongings sent with pt.  Efraim Kaufmann

## 2013-06-03 NOTE — Progress Notes (Signed)
Occupational Therapy Treatment Patient Details Name: Nathan Willis MRN: [id number] DOB: [birth year] Today's Date: 06/03/2013 Time: 4696-2952 OT Time Calculation (min): 20 min  OT Assessment / Plan / Recommendation Comments on Treatment Session Pt demonstrating improvement in bed mobility, transfers,and ability to ambulate. Posterior lean upon initially standing and walks shuffling feet.  Motivated for OOB activity, but distracted by desire to talk to his wife (also a patient) on the phone.    Follow Up Recommendations  SNF    Barriers to Discharge       Equipment Recommendations       Recommendations for Other Services    Frequency Min 2X/week   Plan Discharge plan remains appropriate    Precautions / Restrictions Precautions Precautions: Fall Restrictions Weight Bearing Restrictions: No   Pertinent Vitals/Pain No report of pain.    ADL  Upper Body Dressing: Moderate assistance Where Assessed - Upper Body Dressing: Unsupported sitting Lower Body Dressing: +1 Total assistance Where Assessed - Lower Body Dressing: Unsupported sitting Toilet Transfer: +2 Total assistance Toilet Transfer: Patient Percentage: 40% Toilet Transfer Method: Sit to stand Equipment Used: Rolling walker;Gait belt Transfers/Ambulation Related to ADLs: +2 total assist pt 80% to ambulate within room with verbal cues for technique and to stay inside walker. ADL Comments: Pt distracted by desire to speak to his wife on the phone.    OT Diagnosis:    OT Problem List:   OT Treatment Interventions:     OT Goals ADL Goals Pt Will Perform Grooming: Sitting, edge of bed;with supervision ADL Goal: Grooming - Progress: Progressing toward goals Pt Will Perform Upper Body Bathing: with min assist;Sitting, edge of bed Pt Will Perform Upper Body Dressing: with supervision;Sitting, bed ADL Goal: Upper Body Dressing - Progress: Progressing toward goals Pt Will Transfer to Toilet: with mod assist;Stand pivot  transfer;3-in-1;with DME ADL Goal: Toilet Transfer - Progress: Progressing toward goals Miscellaneous OT Goals Miscellaneous OT Goal #1: Pt will perform supine to sit at EOB with supervision in prep for ADL. OT Goal: Miscellaneous Goal #1 - Progress: Progressing toward goals  Visit Information  Last OT Received On: 06/03/13 Assistance Needed: +2 (for amb) PT/OT Co-Evaluation/Treatment: Yes    Subjective Data      Prior Functioning       Cognition  Cognition Arousal/Alertness: Awake/alert Behavior During Therapy: WFL for tasks assessed/performed Overall Cognitive Status: Impaired/Different from baseline Area of Impairment: Memory;Attention;Safety/judgement;Following commands Current Attention Level: Sustained (distracted by desire to talk to wife on phone) Memory: Decreased short-term memory Following Commands: Follows one step commands with increased time Safety/Judgement: Decreased awareness of safety Problem Solving: Decreased initiation;Slow processing    Mobility  Bed Mobility Bed Mobility: Supine to Sit;Sitting - Scoot to Edge of Bed Supine to Sit: HOB elevated;3: Mod assist Sitting - Scoot to Edge of Bed: 4: Min assist Details for Bed Mobility Assistance: cues for technique,  Transfers Transfers: Sit to Stand;Stand to Sit Sit to Stand: 1: +2 Total assist;From bed;With upper extremity assist Sit to Stand: Patient Percentage: 40% Stand to Sit: 1: +2 Total assist;To chair/3-in-1;With upper extremity assist Stand to Sit: Patient Percentage: 40% Details for Transfer Assistance: uncontrolled descent into chair, cues for hand placement    Exercises      Balance Balance Balance Assessed: Yes   End of Session OT - End of Session Activity Tolerance: Patient limited by fatigue Patient left: in chair;with call bell/phone within reach (talking on phone to wife (also a pt)) Nurse Communication:  (condom cath coming off)  GO     Evern Bio 06/03/2013, 10:46  AM [phone]

## 2013-06-03 NOTE — Progress Notes (Signed)
Pt had 9 beats of v-tach around 2033. Pt asymptomatic. VS stable. MD aware. No new orders given at this time.  Will continue to monitor the pt. Nathan Willis

## 2013-06-03 NOTE — Progress Notes (Signed)
ANTIBIOTIC CONSULT NOTE - FOLLOW UP  Pharmacy Consult for Vanco/Zosyn Indication: Infected arm wound  Allergies  Allergen Reactions  . Baycol (Cerivastatin) Other (See Comments)    REACTION:  unknown  . Codeine Other (See Comments)    REACTION:  "makes me crazy"  . Lescol (Fluvastatin Sodium) Other (See Comments)    REACTION:  unknown  . Lipitor (Atorvastatin) Other (See Comments)    REACTION:  "makes me violent"  . Morphine And Related Other (See Comments)    REACTION:  "makes me crazy"  . Zetia (Ezetimibe) Other (See Comments)    REACTION:  unknown  . Zocor (Simvastatin) Other (See Comments)    REACTION:  unknown    Patient Measurements: Height: 6' (182.9 cm) Weight: 195 lb 15.8 oz (88.9 kg) IBW/kg (Calculated) : 77.6 Adjusted Body Weight:    Vital Signs: Temp: 98 F (36.7 C) (06/11 0522) Temp src: Oral (06/11 0522) BP: 114/83 mmHg (06/11 1009) Pulse Rate: 74 (06/11 1009) Intake/Output from previous day: 06/10 0701 - 06/11 0700 In: 840 [P.O.:840] Out: 400 [Urine:400] Intake/Output from this shift:    Labs:  Recent Labs  06/01/13 0720 06/02/13 0505  WBC 10.5 9.9  HGB 12.8* 13.2  PLT 221 251  CREATININE 1.31 1.18   Estimated Creatinine Clearance: 46.6 ml/min (by C-G formula based on Cr of 1.18). No results found for this basename: VANCOTROUGH, VANCOPEAK, VANCORANDOM, GENTTROUGH, GENTPEAK, GENTRANDOM, TOBRATROUGH, TOBRAPEAK, TOBRARND, AMIKACINPEAK, AMIKACINTROU, AMIKACIN,  in the last 72 hours   Microbiology: Recent Results (from the past 720 hour(s))  WOUND CULTURE     Status: None   Collection Time    05/31/13 11:49 PM      Result Value Range Status   Specimen Description WOUND LEFT ARM   Final   Special Requests NONE   Final   Gram Stain     Final   Value: RARE WBC PRESENT, PREDOMINANTLY PMN     NO SQUAMOUS EPITHELIAL CELLS SEEN     FEW GRAM POSITIVE COCCI IN PAIRS   Culture     Final   Value: ABUNDANT STAPHYLOCOCCUS AUREUS     Note: RIFAMPIN  AND GENTAMICIN SHOULD NOT BE USED AS SINGLE DRUGS FOR TREATMENT OF STAPH INFECTIONS. This organism DOES NOT demonstrate inducible Clindamycin resistance in vitro.   Report Status 06/03/2013 FINAL   Final   Organism ID, Bacteria STAPHYLOCOCCUS AUREUS   Final    Anti-infectives   Start     Dose/Rate Route Frequency Ordered Stop   06/02/13 0000  doxycycline (VIBRA-TABS) 100 MG tablet     100 mg Oral 2 times daily 06/02/13 1025     06/01/13 1000  piperacillin-tazobactam (ZOSYN) IVPB 3.375 g     3.375 g 12.5 mL/hr over 240 Minutes Intravenous Every 8 hours 05/31/13 2358     06/01/13 0000  vancomycin (VANCOCIN) IVPB 1000 mg/200 mL premix     1,000 mg 200 mL/hr over 60 Minutes Intravenous Every 24 hours 05/31/13 2358     06/01/13 0000  piperacillin-tazobactam (ZOSYN) IVPB 3.375 g     3.375 g 100 mL/hr over 30 Minutes Intravenous  Once 05/31/13 2358 06/01/13 0238      Assessment: 79yo male had been hospitalized for NSTEMI and rhabdo after fall and not found x 2 days. Skin tear on arm, now RN reports arm is erythematous, edematous, and warm, to begin IV ABX for cellulitis developing as inpatient. Wife on 5500 after sustaining same fall.  Anticoagulation: Lovenox 40mg /d. CrCl 46. Hgb 13.2. Plts 251  Infectious Disease: Afebrile. WBC 9.9 (down) now on Vanco/Zosyn for severely infected skin tear on arm. MSSA 6/9 Vanco>> 6/9 Zosyn>>  Cardiovascular: chronic sHF with EF 35%.NSTEMI. VSS. Now in Afib/PVCs. Meds: ASA 325mg , Plavix, Imdur, Toprol  Gastrointestinal / Nutrition: Docusate  Neurology: Cognitive impairment and frailty. Not oriented to PPT.  Nephrology Sccr 1.18, Proscar. Still can't urinate. I/O cath overnight.  PTA Medication Issues  Best Practices  Goal of Therapy:  Vancomycin trough level 10-15 mcg/ml  Plan:  Will begin vancomycin 1000mg  IV Q24H and Zosyn 3.375g IV Q8H and monitor CBC, Cx, levels prn. Note Wednesday   Goal of Therapy:  Vancomycin trough level 10-15  mcg/ml  Plan:  Awaiting d/c to SNF. Will not check Vanco trough as could de-escalate abx. Vanco 1g/24h Zosyn 3.375g IV q8h  Misty Stanley Stillinger 06/03/2013,11:11 AM

## 2013-06-03 NOTE — Progress Notes (Signed)
Patient seen evaluated. He thinks that he is at [facility] He denies any specific c/o and is not oriented to time/place or person  O/e Filed Vitals:   06/02/13 0609 06/02/13 1356 06/02/13 2102 06/03/13 0522  BP: 138/86 133/80 129/79 144/92  Pulse: 75 68 79 81  Temp: 97.6 F (36.4 C) 97.7 F (36.5 C) 97.5 F (36.4 C) 98 F (36.7 C)  TempSrc: Oral Oral Oral Oral  Resp: 18 18 18 18   Height:      Weight: 86 kg (189 lb 9.5 oz)   88.9 kg (195 lb 15.8 oz)  SpO2: 100% 99% 96% 97%   Alert but not oriented. No pallor no icterus Chest clinically clear Left upper arm internal aspect shows a wound which is covered with Mepilex dressing and seems to be staying throughout Abdomen soft nontender nondistended  lower extremities soft nontender however there is a bruise over the right shin  A/p As per Dr. Gar Gibbon currently awaiting discharge to skilled nursing facility Contact family when able  Pleas Koch, MD [facility] [phone]

## 2013-06-04 ENCOUNTER — Non-Acute Institutional Stay (SKILLED_NURSING_FACILITY): Payer: Medicare Other | Admitting: Internal Medicine

## 2013-06-04 DIAGNOSIS — N183 Chronic kidney disease, stage 3 unspecified: Secondary | ICD-10-CM

## 2013-06-04 DIAGNOSIS — I214 Non-ST elevation (NSTEMI) myocardial infarction: Secondary | ICD-10-CM

## 2013-06-04 DIAGNOSIS — I509 Heart failure, unspecified: Secondary | ICD-10-CM

## 2013-06-04 DIAGNOSIS — I5042 Chronic combined systolic (congestive) and diastolic (congestive) heart failure: Secondary | ICD-10-CM

## 2013-06-04 DIAGNOSIS — J90 Pleural effusion, not elsewhere classified: Secondary | ICD-10-CM

## 2013-06-15 ENCOUNTER — Other Ambulatory Visit: Payer: Self-pay | Admitting: *Deleted

## 2013-06-15 MED ORDER — LORAZEPAM 0.5 MG PO TABS: ORAL_TABLET | ORAL | Status: DC

## 2013-06-25 ENCOUNTER — Non-Acute Institutional Stay (SKILLED_NURSING_FACILITY): Payer: Medicare Other | Admitting: Adult Health

## 2013-06-25 ENCOUNTER — Encounter: Payer: Self-pay | Admitting: Adult Health

## 2013-06-25 DIAGNOSIS — N390 Urinary tract infection, site not specified: Secondary | ICD-10-CM

## 2013-06-25 DIAGNOSIS — I10 Essential (primary) hypertension: Secondary | ICD-10-CM

## 2013-06-25 DIAGNOSIS — I509 Heart failure, unspecified: Secondary | ICD-10-CM

## 2013-06-25 DIAGNOSIS — I5042 Chronic combined systolic (congestive) and diastolic (congestive) heart failure: Secondary | ICD-10-CM

## 2013-06-25 DIAGNOSIS — N400 Enlarged prostate without lower urinary tract symptoms: Secondary | ICD-10-CM

## 2013-06-25 DIAGNOSIS — I2510 Atherosclerotic heart disease of native coronary artery without angina pectoris: Secondary | ICD-10-CM

## 2013-06-25 DIAGNOSIS — J189 Pneumonia, unspecified organism: Secondary | ICD-10-CM

## 2013-06-25 DIAGNOSIS — E785 Hyperlipidemia, unspecified: Secondary | ICD-10-CM | POA: Insufficient documentation

## 2013-06-25 DIAGNOSIS — E876 Hypokalemia: Secondary | ICD-10-CM

## 2013-06-25 NOTE — Progress Notes (Signed)
Patient ID: Nathan Willis, male   DOB: 02/13/1922, 79 y.o.   MRN: [id number]        HISTORY & PHYSICAL  DATE: 06/04/2013   FACILITY: [facility]  LEVEL OF CARE: SNF (31)  ALLERGIES:  Allergies  Allergen Reactions  . Baycol (Cerivastatin) Other (See Comments)    REACTION:  unknown  . Codeine Other (See Comments)    REACTION:  "makes me crazy"  . Lescol (Fluvastatin Sodium) Other (See Comments)    REACTION:  unknown  . Lipitor (Atorvastatin) Other (See Comments)    REACTION:  "makes me violent"  . Morphine And Related Other (See Comments)    REACTION:  "makes me crazy"  . Zetia (Ezetimibe) Other (See Comments)    REACTION:  unknown  . Zocor (Simvastatin) Other (See Comments)    REACTION:  unknown    CHIEF COMPLAINT:  Manage acute MI, chronic kidney disease stage III, and CHF.    HISTORY OF PRESENT ILLNESS:  The patient is an 79 year-old, Caucasian male.    CAD: The patient had a fall and was hospitalized where he was diagnosed with a non-ST elevation acute MI.  He had elevated troponin and EKG changes.  Interventional work-up was not elected.  Medical management was chosen.  The angina has been stable. The patient denies dyspnea on exertion, orthopnea, pedal edema, palpitations and paroxysmal nocturnal dyspnea. No complications noted from the medication presently being used.  The patient is admitted to this facility for short-term rehabilitation after hospitalization.    CHRONIC KIDNEY DISEASE: The patient's chronic kidney disease remains stable.  Patient denies increasing lower extremity swelling or confusion. Last BUN and creatinine are:  35 and 1.18.    CHF:The patient does not relate significant weight changes, denies sob, DOE, orthopnea, PNDs, palpitations or chest pain.  He is having swelling in bilateral upper extremities and bilateral lower extremities.   CHF remains stable.  No complications form the medications being used.   PAST MEDICAL HISTORY :   Past Medical History  Diagnosis Date  . Coronary artery disease   . Hypertension   . Angina at rest   . Hyperlipidemia   . CVA (cerebral infarction)     "at least one since 2011", denies residual (05/29/2013)  . BPH (benign prostatic hyperplasia)   . Idiopathic polyneuropathy   . Peripheral neuropathy   . Pneumonia     [name] 02/25/2001 (05/29/2013)  . Bursitis   . Skin cancer     "off face & arms" (05/29/2013)    PAST SURGICAL HISTORY: Past Surgical History  Procedure Laterality Date  . Appendectomy  1987    [name] 02/25/2001 (05/29/2013)  . Inguinal hernia repair Right   . Coronary artery bypass graft  1997    [name] 02/25/2001 (05/29/2013)  . Transurethral resection of prostate  1993    [name] 02/25/2001 (05/29/2013)  . Coronary angioplasty    . Reconstructive repair sternal  02/2001    sternal rewiring/notes 02/25/2001 (05/29/2013)    SOCIAL HISTORY:  reports that he has quit smoking. His smoking use included Cigarettes. He has a 60 pack-year smoking history. He has never used smokeless tobacco. He reports that  drinks alcohol. He reports that he does not use illicit drugs.  FAMILY HISTORY:  Family History  Problem Relation Age of Onset  . Family history unknown: Yes    CURRENT MEDICATIONS: Reviewed per MAR  REVIEW OF SYSTEMS:  See HPI otherwise 14 point ROS is negative.  PHYSICAL  EXAMINATION  VS:  T 98.4       P 78      RR 12       BP 121/71      POX 96% room air        WT (Lb)  GENERAL: no acute distress, moderately obese body habitus SKIN: warm & dry, no suspicious lesions or rashes, no excessive dryness EYES: conjunctivae normal, sclerae normal, normal eye lids MOUTH/THROAT: lips without lesions,no lesions in the mouth,tongue is without lesions,uvula elevates in midline NECK: supple, trachea midline, no neck masses, no thyroid tenderness, no thyromegaly LYMPHATICS: no LAN in the neck, no supraclavicular LAN RESPIRATORY: breathing is even & unlabored, BS CTAB CARDIAC: RRR, no  murmur,no extra heart sounds EDEMA/VARICOSITIES: bilateral upper extremities have +2 edema, bilateral lower extremities have +1 edema  ARTERIAL:  pedal pulses +1  GI:  ABDOMEN: abdomen soft, normal BS, no masses, no tenderness  LIVER/SPLEEN: no hepatomegaly, no splenomegaly MUSCULOSKELETAL: HEAD: normal to inspection & palpation BACK: no kyphosis, scoliosis or spinal processes tenderness EXTREMITIES: LEFT UPPER EXTREMITY: strength decreased, range of motion minimal  RIGHT UPPER EXTREMITY: strength decreased, range of motion minimal  LEFT LOWER EXTREMITY: strength decreased, range of motion minimal  RIGHT LOWER EXTREMITY: strength decreased, range of motion minimal  PSYCHIATRIC: the patient is alert & oriented to person, decreased affect and mood   LABS/RADIOLOGY: CT of the head:  No acute findings.    CT of the cervical spine:  No acute fracture.    Chest x-ray showed stable moderate left and right pleural effusions.    Left shoulder x-ray did not show any acute findings.    Left hand x-ray did not show acute findings.    Left arm wound culture showed abundant Staphylococcus aureus.    Glucose 134, BUN 25, otherwise BMP normal.    AST 65, albumin 3.4, otherwise liver profile normal.    CBC normal.   Total CK 344, troponin-I 0.82.   ASSESSMENT/PLAN:  Non-ST elevation acute MI.  Continue medical management.    Chronic kidney disease stage III.  Reassess renal functions.   CHF.  Adequately compensated.   Left pleural effusion.  Stable.   Left arm cellulitis.  Continue doxycycline as prescribed.    History of CVA.  Continue aspirin and Plavix.   BPH.  Continue Proscar.    Check CBC and BMP.   I have reviewed patient's medical records received at admission/from hospitalization.  CPT CODE: 16109

## 2013-06-25 NOTE — Progress Notes (Signed)
  Subjective:    Patient ID: Nathan Willis, male    DOB: 1922/03/02, 79 y.o.   MRN: [id number]  HPI This is an 79 year old male who was noted to have shortness of breath and wheezing this morning. He was given Lasix IM. Patient was noted to be sitting on the edge of the bed and verbally responsive. No wheezing noted. Chest x-ray ordered and showed pneumonitis. Noted to have K=3.3 and BNP 1575.1 Urine culture shows >=100,000 colonies/ml  E. Coli. I have talked to son about the patient's condition.   Review of Systems  Constitutional: Negative.   HENT: Negative.   Eyes: Negative.   Respiratory: Positive for shortness of breath and wheezing.   Cardiovascular: Positive for leg swelling. Negative for chest pain.  Gastrointestinal: Negative for abdominal pain and abdominal distention.  Endocrine: Negative.   Genitourinary: Negative.   Neurological: Negative.   Hematological: Negative for adenopathy. Does not bruise/bleed easily.  Psychiatric/Behavioral: Negative.        Objective:   Physical Exam  Nursing note and vitals reviewed. Constitutional: He is oriented to person, place, and time. He appears well-developed and well-nourished.  HENT:  Head: Normocephalic and atraumatic.  Right Ear: External ear normal.  Left Ear: External ear normal.  Nose: Nose normal.  Mouth/Throat: Oropharynx is clear and moist.  Eyes: Conjunctivae and EOM are normal. Pupils are equal, round, and reactive to light.  Neck: Normal range of motion. Neck supple.  Cardiovascular: Normal rate, regular rhythm and normal heart sounds.   Pulmonary/Chest: Effort normal and breath sounds normal. No respiratory distress. He has no wheezes. He has no rales. He exhibits no tenderness.  Abdominal: Soft. Bowel sounds are normal. He exhibits no distension. There is no tenderness.  Musculoskeletal: Normal range of motion. He exhibits edema. He exhibits no tenderness.  BLE edema, 2+ and LUE edema, 2+  Neurological: He is  alert and oriented to person, place, and time.  Skin: Skin is warm and dry.  Psychiatric: He has a normal mood and affect. His behavior is normal. Judgment and thought content normal.     LABS/PROCEDURES: 06/25/13  Wbc 7.4  hgb 10.9  hct 33.4  NA 142  K 3.3  Glucose 159  BUN 20  Creatinine 1.22   Calcium 8.1  BNP 1575.1 ;  Urine culture >=100,000 colonies/ml E. Coli 06/12/13  Left shoulder shows mild degenerative irregularity superolateral left humeral head ; left humerus and left elbow is negative for fracture 06/05/13  Wbc 10.7  hgb 14.1  hct 42.7  NA 140  K 4.4  Glucose 159  BUN 25  Creatinine 1.47   Calcium 8.8   Medications reviewed.    Assessment & Plan:   Hyperlipidemia- stable  CKD (chronic kidney disease) stage 3, GFR 30-59 ml/min - stable  Systolic and diastolic CHF, chronic - start Lasix 40 mg PO Q D and cardiology consult; weight 3X/week and notify NP/MD +- 5 lbs; start Albuterol 2.5 mg/3 ml 1 neb Q 8 hours x 3 days then PRN  Coronary artery disease - stable  Hypertension - well-controlled  BPH (benign prostatic hyperplasia) - stable  Hypokalemia - KCL 20 meq 1 tab PO BID x 1 day and BMP on 06/29/13  Pneumonitis -  Start Ceftriaxone 1 gm IM Q D x 7 days and may reconstitute with Lidocaine  UTI - start Ceftriaxone as above

## 2013-07-13 ENCOUNTER — Non-Acute Institutional Stay (SKILLED_NURSING_FACILITY): Payer: Medicare Other | Admitting: Internal Medicine

## 2013-07-13 DIAGNOSIS — N400 Enlarged prostate without lower urinary tract symptoms: Secondary | ICD-10-CM

## 2013-07-13 DIAGNOSIS — N183 Chronic kidney disease, stage 3 unspecified: Secondary | ICD-10-CM

## 2013-07-13 DIAGNOSIS — I6350 Cerebral infarction due to unspecified occlusion or stenosis of unspecified cerebral artery: Secondary | ICD-10-CM

## 2013-07-13 DIAGNOSIS — I2510 Atherosclerotic heart disease of native coronary artery without angina pectoris: Secondary | ICD-10-CM

## 2013-07-13 DIAGNOSIS — I639 Cerebral infarction, unspecified: Secondary | ICD-10-CM

## 2013-08-11 NOTE — Progress Notes (Signed)
Patient ID: Nathan Willis, male   DOB: July 07, 1922, 79 y.o.   MRN: [id number]        PROGRESS NOTE  DATE: 07/13/2013   FACILITY: [facility]  LEVEL OF CARE: SNF (31)  Discharge Visit  CHIEF COMPLAINT:  Manage coronary artery disease and chronic kidney disease stage III.    HISTORY OF PRESENT ILLNESS: I was requested by the social worker to perform face-to-face evaluation for discharge:  Patient was admitted to this facility for short-term rehabilitation after the patient's recent hospitalization.  Patient has completed SNF rehabilitation and therapy has cleared the patient for discharge.  Reassessment of ongoing problem(s):  CAD: The patient was hospitalized for an acute MI.  The angina has been stable. The staff denies dyspnea on exertion, orthopnea, pedal edema, palpitations and paroxysmal nocturnal dyspnea. No complications noted from the medication presently being used.    CHRONIC KIDNEY DISEASE: The patient's chronic kidney disease remains stable.  Staff denies increasing lower extremity swelling or confusion. Last BUN and creatinine are:   On 07/07/2013:  BUN 20, creatinine 1.26.     PAST MEDICAL HISTORY : Reviewed.  No changes.  CURRENT MEDICATIONS: Reviewed per [facility]  REVIEW OF SYSTEMS:  Unobtainable.  Patient is a poor historian.      PHYSICAL EXAMINATION  VS:  T 97.1      P 74      RR 21     BP 98/50     POX 96% room air      WT (Lb)  GENERAL: no acute distress, normal body habitus NECK: supple, trachea midline, no neck masses, no thyroid tenderness, no thyromegaly RESPIRATORY: breathing is even & unlabored, BS CTAB CARDIAC: RRR, no murmur,no extra heart sounds EDEMA/VARICOSITIES:  +2 bilateral lower extremity edema  ARTERIAL:  pedal pulses nonpalpable   GI: abdomen soft, normal BS, no masses, no tenderness, no hepatomegaly, no splenomegaly PSYCHIATRIC: the patient is minimally alert & oriented to person, decreased affect and mood     LABS/RADIOLOGY: 06/2013:  Glucose, otherwise CMP normal.    Hemoglobin 10.9, MCV 91, otherwise CBC normal.    ASSESSMENT/PLAN:  CAD.  Stable.    Chronic kidney disease stage III.  Renal functions have normalized.    History of CVA.  Stable.    BPH.  Continue current medications.    Renovascular hypertension.  Adequately controlled.    I have filled out patient's discharge paperwork and written prescriptions.    Total discharge time: Less than 30 minutes Discharge time involved coordination of the discharge process with Child psychotherapist, nursing staff and therapy department. Medical justification for home health services/DME verified.  CPT CODE: 40981

## 2013-09-01 ENCOUNTER — Institutional Professional Consult (permissible substitution): Payer: Medicare Other | Admitting: Cardiology

## 2013-11-12 ENCOUNTER — Ambulatory Visit (INDEPENDENT_AMBULATORY_CARE_PROVIDER_SITE_OTHER): Payer: Medicare Other | Admitting: Podiatry

## 2013-11-12 ENCOUNTER — Encounter: Payer: Self-pay | Admitting: Podiatry

## 2013-11-12 VITALS — Ht 70.0 in | Wt 183.0 lb

## 2013-11-12 DIAGNOSIS — M79609 Pain in unspecified limb: Secondary | ICD-10-CM

## 2013-11-12 DIAGNOSIS — B351 Tinea unguium: Secondary | ICD-10-CM

## 2013-11-12 NOTE — Progress Notes (Deleted)
"  His toenails need to be cut and his feet are blue," stated patient's power of attorney.   N   Long, thick L  Debridement b/l toenails D  A while O  gradually C  Gotten worse A  None  T  None

## 2013-11-12 NOTE — Progress Notes (Deleted)
  Subjective:    Patient ID: Nathan Willis, male    DOB: 10-14-1922, 79 y.o.   MRN: [id number]  HPI    Review of Systems     Objective:   Physical Exam        Assessment & Plan:

## 2013-11-12 NOTE — Progress Notes (Signed)
Nathan Willis presents today as an 79 year old white male with an Geophysicist/field seismologist. Complaining about painfully elongated nails.  Objective: Vital signs are stable he is alert and oriented x3. I have reviewed his past medical history medications and allergies. Pulses remain palpable bilateral lower extremity. Neurologic sensorium is intact per Semmes-Weinstein monofilament and deep tendon reflexes are intact bilateral. Muscle strength is normal +5 over 5 dorsiflexors plantar flexors inverters and evertors. Orthopedic evaluation demonstrates all joints distal to the ankle a full range of motion without crepitus. He does have mild hammertoe deformities 1 through 5 bilateral. Nails are thick yellow dystrophic clinically mycotic 1 through 5 bilateral. They're painful palpation as well as debridement.  Assessment: Pain in limb secondary to onychomycosis 1 through 5 bilateral.  Plan: Debridement of nails 1 through 5 bilateral is a covered service secondary to pain.

## 2013-12-08 ENCOUNTER — Encounter (HOSPITAL_COMMUNITY): Payer: Self-pay | Admitting: Emergency Medicine

## 2013-12-08 ENCOUNTER — Emergency Department (HOSPITAL_COMMUNITY): Payer: Medicare Other

## 2013-12-08 ENCOUNTER — Inpatient Hospital Stay (HOSPITAL_COMMUNITY)
Admission: EM | Admit: 2013-12-08 | Discharge: 2013-12-10 | DRG: 871 | Disposition: A | Discharge status: Nursing Home (Includes ICF) | Payer: Medicare Other | Attending: Internal Medicine | Admitting: Internal Medicine

## 2013-12-08 DIAGNOSIS — Z951 Presence of aortocoronary bypass graft: Secondary | ICD-10-CM

## 2013-12-08 DIAGNOSIS — Z79899 Other long term (current) drug therapy: Secondary | ICD-10-CM

## 2013-12-08 DIAGNOSIS — E785 Hyperlipidemia, unspecified: Secondary | ICD-10-CM | POA: Diagnosis present

## 2013-12-08 DIAGNOSIS — I129 Hypertensive chronic kidney disease with stage 1 through stage 4 chronic kidney disease, or unspecified chronic kidney disease: Secondary | ICD-10-CM | POA: Diagnosis present

## 2013-12-08 DIAGNOSIS — Z8673 Personal history of transient ischemic attack (TIA), and cerebral infarction without residual deficits: Secondary | ICD-10-CM

## 2013-12-08 DIAGNOSIS — Z85828 Personal history of other malignant neoplasm of skin: Secondary | ICD-10-CM

## 2013-12-08 DIAGNOSIS — I639 Cerebral infarction, unspecified: Secondary | ICD-10-CM | POA: Diagnosis present

## 2013-12-08 DIAGNOSIS — I2510 Atherosclerotic heart disease of native coronary artery without angina pectoris: Secondary | ICD-10-CM | POA: Diagnosis present

## 2013-12-08 DIAGNOSIS — J90 Pleural effusion, not elsewhere classified: Secondary | ICD-10-CM

## 2013-12-08 DIAGNOSIS — I959 Hypotension, unspecified: Secondary | ICD-10-CM

## 2013-12-08 DIAGNOSIS — N179 Acute kidney failure, unspecified: Secondary | ICD-10-CM

## 2013-12-08 DIAGNOSIS — I10 Essential (primary) hypertension: Secondary | ICD-10-CM

## 2013-12-08 DIAGNOSIS — N183 Chronic kidney disease, stage 3 unspecified: Secondary | ICD-10-CM | POA: Diagnosis present

## 2013-12-08 DIAGNOSIS — A419 Sepsis, unspecified organism: Principal | ICD-10-CM | POA: Diagnosis present

## 2013-12-08 DIAGNOSIS — G609 Hereditary and idiopathic neuropathy, unspecified: Secondary | ICD-10-CM | POA: Diagnosis present

## 2013-12-08 DIAGNOSIS — F0390 Unspecified dementia without behavioral disturbance: Secondary | ICD-10-CM | POA: Diagnosis present

## 2013-12-08 DIAGNOSIS — Z87891 Personal history of nicotine dependence: Secondary | ICD-10-CM

## 2013-12-08 DIAGNOSIS — Z9861 Coronary angioplasty status: Secondary | ICD-10-CM

## 2013-12-08 DIAGNOSIS — J189 Pneumonia, unspecified organism: Secondary | ICD-10-CM | POA: Diagnosis present

## 2013-12-08 DIAGNOSIS — E876 Hypokalemia: Secondary | ICD-10-CM | POA: Diagnosis present

## 2013-12-08 DIAGNOSIS — Z7982 Long term (current) use of aspirin: Secondary | ICD-10-CM

## 2013-12-08 HISTORY — DX: Unspecified dementia, unspecified severity, without behavioral disturbance, psychotic disturbance, mood disturbance, and anxiety: F03.90

## 2013-12-08 LAB — CBC WITH DIFFERENTIAL/PLATELET
Basophils Absolute: 0.1 10*3/uL (ref 0.0–0.1)
Eosinophils Relative: 0 % (ref 0–5)
HCT: 36.7 % — ABNORMAL LOW (ref 39.0–52.0)
Hemoglobin: 12.1 g/dL — ABNORMAL LOW (ref 13.0–17.0)
Lymphocytes Relative: 7 % — ABNORMAL LOW (ref 12–46)
Lymphs Abs: 0.9 10*3/uL (ref 0.7–4.0)
MCV: 94.1 fL (ref 78.0–100.0)
Monocytes Absolute: 1.1 10*3/uL — ABNORMAL HIGH (ref 0.1–1.0)
Monocytes Relative: 9 % (ref 3–12)
Neutro Abs: 9.9 10*3/uL — ABNORMAL HIGH (ref 1.7–7.7)
RBC: 3.9 MIL/uL — ABNORMAL LOW (ref 4.22–5.81)
RDW: 15 % (ref 11.5–15.5)
WBC: 11.9 10*3/uL — ABNORMAL HIGH (ref 4.0–10.5)

## 2013-12-08 LAB — URINALYSIS, ROUTINE W REFLEX MICROSCOPIC
Bilirubin Urine: NEGATIVE
Glucose, UA: NEGATIVE mg/dL
Leukocytes, UA: NEGATIVE
Nitrite: NEGATIVE
Specific Gravity, Urine: 1.014 (ref 1.005–1.030)
Urobilinogen, UA: 0.2 mg/dL (ref 0.0–1.0)

## 2013-12-08 LAB — URINE MICROSCOPIC-ADD ON

## 2013-12-08 LAB — COMPREHENSIVE METABOLIC PANEL
ALT: 10 U/L (ref 0–53)
BUN: 36 mg/dL — ABNORMAL HIGH (ref 6–23)
CO2: 24 mEq/L (ref 19–32)
Calcium: 8.8 mg/dL (ref 8.4–10.5)
Chloride: 101 mEq/L (ref 96–112)
Creatinine, Ser: 1.65 mg/dL — ABNORMAL HIGH (ref 0.50–1.35)
GFR calc Af Amer: 41 mL/min — ABNORMAL LOW (ref >90)
GFR calc non Af Amer: 35 mL/min — ABNORMAL LOW (ref >90)
Glucose, Bld: 142 mg/dL — ABNORMAL HIGH (ref 70–99)
Potassium: 4.1 mEq/L (ref 3.5–5.1)
Sodium: 138 mEq/L (ref 135–145)

## 2013-12-08 LAB — CG4 I-STAT (LACTIC ACID): Lactic Acid, Venous: 1.31 mmol/L (ref 0.5–2.2)

## 2013-12-08 MED ORDER — FINASTERIDE 5 MG PO TABS
5.0000 mg | ORAL_TABLET | Freq: Every day | ORAL | Status: DC
  Administered 2013-12-09 – 2013-12-10 (×2): 5 mg via ORAL
  Filled 2013-12-08 (×2): qty 1

## 2013-12-08 MED ORDER — SODIUM CHLORIDE 0.9 % IV SOLN
1250.0000 mg | INTRAVENOUS | Status: DC
  Administered 2013-12-09 (×2): 1250 mg via INTRAVENOUS
  Filled 2013-12-08 (×3): qty 1250

## 2013-12-08 MED ORDER — VANCOMYCIN HCL IN DEXTROSE 1-5 GM/200ML-% IV SOLN
1000.0000 mg | Freq: Once | INTRAVENOUS | Status: AC
  Administered 2013-12-08: 1000 mg via INTRAVENOUS
  Filled 2013-12-08: qty 200

## 2013-12-08 MED ORDER — ASPIRIN EC 325 MG PO TBEC
325.0000 mg | DELAYED_RELEASE_TABLET | Freq: Every day | ORAL | Status: DC
  Administered 2013-12-09 – 2013-12-10 (×2): 325 mg via ORAL
  Filled 2013-12-08 (×2): qty 1

## 2013-12-08 MED ORDER — SODIUM CHLORIDE 0.9 % IV SOLN
INTRAVENOUS | Status: DC
  Administered 2013-12-08 – 2013-12-10 (×2): via INTRAVENOUS

## 2013-12-08 MED ORDER — DEXTROSE 5 % IV SOLN
1.0000 g | Freq: Three times a day (TID) | INTRAVENOUS | Status: DC
  Administered 2013-12-09 (×2): 1 g via INTRAVENOUS
  Filled 2013-12-08 (×4): qty 1

## 2013-12-08 MED ORDER — ALBUTEROL SULFATE (5 MG/ML) 0.5% IN NEBU
2.5000 mg | INHALATION_SOLUTION | RESPIRATORY_TRACT | Status: DC | PRN
  Filled 2013-12-08: qty 0.5

## 2013-12-08 MED ORDER — SODIUM CHLORIDE 0.9 % IV SOLN
1000.0000 mL | Freq: Once | INTRAVENOUS | Status: AC
  Administered 2013-12-08: 1000 mL via INTRAVENOUS

## 2013-12-08 MED ORDER — ACETAMINOPHEN 325 MG PO TABS
650.0000 mg | ORAL_TABLET | Freq: Four times a day (QID) | ORAL | Status: DC | PRN
  Administered 2013-12-08: 650 mg via ORAL
  Filled 2013-12-08: qty 2

## 2013-12-08 MED ORDER — DEXTROSE 5 % IV SOLN
2.0000 g | INTRAVENOUS | Status: DC
  Filled 2013-12-08: qty 2

## 2013-12-08 MED ORDER — ACETAMINOPHEN 325 MG PO TABS: 650.0000 mg | ORAL_TABLET | Freq: Four times a day (QID) | ORAL | Status: DC | PRN

## 2013-12-08 MED ORDER — HEPARIN SODIUM (PORCINE) 5000 UNIT/ML IJ SOLN
5000.0000 [IU] | Freq: Three times a day (TID) | INTRAMUSCULAR | Status: DC
  Administered 2013-12-09 – 2013-12-10 (×6): 5000 [IU] via SUBCUTANEOUS
  Filled 2013-12-08 (×9): qty 1

## 2013-12-08 MED ORDER — DEXTROSE 5 % IV SOLN
2.0000 g | Freq: Once | INTRAVENOUS | Status: AC
  Administered 2013-12-08: 2 g via INTRAVENOUS

## 2013-12-08 NOTE — ED Notes (Signed)
Attempted to call pt's wife with pt. No answer at this time.

## 2013-12-08 NOTE — ED Provider Notes (Signed)
TIME SEEN: 7:06 PM  CHIEF COMPLAINT: Fever, cough, chest pain  HPI: Patient is a 79 y.o. male with a history of coronary artery disease, hypertension, CVA, dementia who lives at a nursing facility who presents the emergency department with an episode of left-sided burning chest pain that started this afternoon and lasted for approximately 1 hour. There is no associated shortness of breath. Patient was evaluated by a clinician at the nursing facility who sent him to the emergency department for further evaluation. Here patient has a fever and is mildly hypertensive. He denies any current chest pain. Denies any vomiting or diarrhea. No abdominal pain. No dysuria or hematuria. No skin rash. No sore throat or ear pain. No headache, neck pain or neck stiffness.  Primary care physician was Dr. Fabian Sharp. Patient is now living at [facility].  ROS: See HPI Constitutional:  fever  Eyes: no drainage  ENT: no runny nose   Cardiovascular:   chest pain  Resp: no SOB  GI: no vomiting GU: no dysuria Integumentary: no rash  Allergy: no hives  Musculoskeletal: no leg swelling  Neurological: no slurred speech ROS otherwise negative  PAST MEDICAL HISTORY/PAST SURGICAL HISTORY:  Past Medical History  Diagnosis Date  . Coronary artery disease   . Hypertension   . Angina at rest   . Hyperlipidemia   . CVA (cerebral infarction)     "at least one since 2011", denies residual (05/29/2013)  . BPH (benign prostatic hyperplasia)   . Idiopathic polyneuropathy   . Peripheral neuropathy   . Pneumonia     [name] 02/25/2001 (05/29/2013)  . Bursitis   . Skin cancer     "off face & arms" (05/29/2013)  . Dementia     MEDICATIONS:  Prior to Admission medications   Medication Sig Start Date End Date Taking? Authorizing Provider  acetaminophen (TYLENOL) 325 MG tablet Take 2 tablets (650 mg total) by mouth every 6 (six) hours as needed. 06/02/13  Yes [name] Isaiah Blakes, MD  albuterol (PROVENTIL) (2.5 MG/3ML)  0.083% nebulizer solution Take 2.5 mg by nebulization every 8 (eight) hours as needed for wheezing or shortness of breath.   Yes Historical Provider, MD  aspirin EC 325 MG EC tablet Take 1 tablet (325 mg total) by mouth daily. 06/02/13  Yes Henderson Cloud, MD  finasteride (PROSCAR) 5 MG tablet Take 5 mg by mouth daily.   Yes Historical Provider, MD  furosemide (LASIX) 40 MG tablet Take 40 mg by mouth daily.    Yes Historical Provider, MD  isosorbide mononitrate (IMDUR) 120 MG 24 hr tablet Take 120 mg by mouth daily.   Yes Historical Provider, MD  loperamide (IMODIUM) 2 MG capsule Take 2 mg by mouth as needed for diarrhea or loose stools.   Yes Historical Provider, MD  LORazepam (ATIVAN) 0.5 MG tablet Take 0.5 mg by mouth 2 (two) times daily as needed for anxiety.   Yes Historical Provider, MD  metoprolol succinate (TOPROL-XL) 25 MG 24 hr tablet Take 25 mg by mouth daily.   Yes Historical Provider, MD  Multiple Vitamins-Minerals (DECUBI-VITE) CAPS Take 1 capsule by mouth daily.    Yes Historical Provider, MD  pravastatin (PRAVACHOL) 20 MG tablet Take 20 mg by mouth daily.   Yes Historical Provider, MD    ALLERGIES:  Allergies  Allergen Reactions  . Baycol [Cerivastatin] Other (See Comments)    REACTION:  unknown  . Codeine Other (See Comments)    REACTION:  "makes me crazy"  .  Lescol [Fluvastatin Sodium] Other (See Comments)    REACTION:  unknown  . Lipitor [Atorvastatin] Other (See Comments)    REACTION:  "makes me violent"  . Morphine And Related Other (See Comments)    REACTION:  "makes me crazy"  . Zetia [Ezetimibe] Other (See Comments)    REACTION:  unknown  . Zocor [Simvastatin] Other (See Comments)    REACTION:  unknown    SOCIAL HISTORY:  History  Substance Use Topics  . Smoking status: Former Smoker -- 2.00 packs/day for 30 years    Types: Cigarettes  . Smokeless tobacco: Never Used     Comment: 05/29/2013 "been quit smoking for 30 yrs"  . Alcohol Use: No      Comment: 05/29/2013 "quit alcohol 30 yr ago"    FAMILY HISTORY: No family history on file.  EXAM: BP 88/48  Pulse 90  Temp(Src) 100.9 F (38.3 C) (Rectal)  Resp 25  Ht 6' (1.829 m)  Wt 178 lb (80.74 kg)  BMI 24.14 kg/m2  SpO2 95% CONSTITUTIONAL: Alert and oriented to person and place and responds appropriately to questions. Elderly, no apparent distress HEAD: Normocephalic EYES: Conjunctivae clear, PERRL ENT: normal nose; no rhinorrhea; moist mucous membranes; pharynx without lesions noted NECK: Supple, no meningismus, no LAD  CARD: RRR; S1 and S2 appreciated; no murmurs, no clicks, no rubs, no gallops RESP: Normal chest excursion without splinting or tachypnea; breath sounds clear and equal bilaterally; no wheezes, no rhonchi, crackles at his left lower lobe ABD/GI: Normal bowel sounds; non-distended; soft, non-tender, no rebound, no guarding BACK:  The back appears normal and is non-tender to palpation, there is no CVA tenderness EXT: Normal ROM in all joints; non-tender to palpation; no edema; normal capillary refill; no cyanosis    SKIN: Normal color for age and race; warm NEURO: Moves all extremities equally PSYCH: The patient's mood and manner are appropriate. Grooming and personal hygiene are appropriate.  MEDICAL DECISION MAKING: Patient here with fever, mild hypertension has improved with IV fluids and crackles at his left lung base. Suspect possible pneumonia. Will give broad-spectrum antibiotics, continue IV fluids, obtain labs and cultures. Patient will need admission.  ED PROGRESS: Chest x-ray confirms a left basilar infiltrate with effusion. Patient has a leukocytosis of 11.9 with left shift. He is receiving vancomycin and cefepime. His lactate is 1.31. Will admit. His blood pressure continues to improve with IV fluids.   Discuss with hospitalist for admission for pneumonia and sepsis to step down. Patient's blood pressures are stable.    EKG Interpretation     Date/Time:  Tuesday December 08 2013 18:35:05 EST Ventricular Rate:  90 PR Interval:  188 QRS Duration: 106 QT Interval:  398 QTC Calculation: 487 R Axis:   -132 Text Interpretation:  Normal sinus rhythm with PACs Anterior infarct, old No significant change since last tracing Confirmed by Shigeru Lampert  DO, Kaydra Borgen ([id number]) on 12/08/2013 7:05:50 PM             CRITICAL CARE Performed by: Raelyn Number   Total critical care time: 30 minutes  Critical care time was exclusive of separately billable procedures and treating other patients.  Patient with pneumonia, hypotension, sepsis.  Critical care was necessary to treat or prevent imminent or life-threatening deterioration.  Critical care was time spent personally by me on the following activities: development of treatment plan with patient and/or surrogate as well as nursing, discussions with consultants, evaluation of patient's response to treatment, examination of patient, obtaining history from patient  or surrogate, ordering and performing treatments and interventions, ordering and review of laboratory studies, ordering and review of radiographic studies, pulse oximetry and re-evaluation of patient's condition.     Layla Maw Callaway Hailes, DO 12/08/13 2052

## 2013-12-08 NOTE — ED Notes (Signed)
Pt's wife's phone number to their shared room at nursing home: [phone] Nursing home number: [phone]

## 2013-12-08 NOTE — ED Notes (Signed)
Per EMS - pt coming from a memory care unit at [facility]. Pt has alzheimer's. Pt c/o CP earlier today but now pt denies pain. Went to doctor today they drew bld. Nathan Willis. The facility was told that something was elevated and called EMS for further evaluation. Pt in a.fib. 12 lead unremarkable. Not taking bld thinners. Takes 345 ASA everyday. Temp 100. BP initially 89/54, last BP 107/52. Pt denies any complaints. Nad, skin warm and dry, resp e/u.

## 2013-12-08 NOTE — ED Notes (Signed)
Hospitalist at bedside 

## 2013-12-08 NOTE — ED Notes (Signed)
2nd blood culture drawn prior to starting antibiotic.

## 2013-12-08 NOTE — Progress Notes (Signed)
ANTIBIOTIC CONSULT NOTE - INITIAL  Pharmacy Consult for Vancomycin, Cefepime  Indication: rule out sepsis  Allergies  Allergen Reactions  . Baycol [Cerivastatin] Other (See Comments)    REACTION:  unknown  . Codeine Other (See Comments)    REACTION:  "makes me crazy"  . Lescol [Fluvastatin Sodium] Other (See Comments)    REACTION:  unknown  . Lipitor [Atorvastatin] Other (See Comments)    REACTION:  "makes me violent"  . Morphine And Related Other (See Comments)    REACTION:  "makes me crazy"  . Zetia [Ezetimibe] Other (See Comments)    REACTION:  unknown  . Zocor [Simvastatin] Other (See Comments)    REACTION:  unknown    Patient Measurements: Height: 6' (182.9 cm) Weight: 178 lb (80.74 kg) IBW/kg (Calculated) : 77.6 Adjusted Body Weight: n/a   Vital Signs: Temp: 100.9 F (38.3 C) (12/16 1839) Temp src: Rectal (12/16 1839) BP: 119/60 mmHg (12/16 2006) Pulse Rate: 57 (12/16 2006) Intake/Output from previous day:   Intake/Output from this shift: Total I/O In: 1050 [I.V.:1050] Out: -   Labs:  Recent Labs  12/08/13 1857  WBC 11.9*  HGB 12.1*  PLT 251  CREATININE 1.65*   Estimated Creatinine Clearance: 33.3 ml/min (by C-G formula based on Cr of 1.65). No results found for this basename: VANCOTROUGH, VANCOPEAK, VANCORANDOM, GENTTROUGH, GENTPEAK, GENTRANDOM, TOBRATROUGH, TOBRAPEAK, TOBRARND, AMIKACINPEAK, AMIKACINTROU, AMIKACIN,  in the last 72 hours   Microbiology: No results found for this or any previous visit (from the past 720 hour(s)).  Medical History: Past Medical History  Diagnosis Date  . Coronary artery disease   . Hypertension   . Angina at rest   . Hyperlipidemia   . CVA (cerebral infarction)     "at least one since 2011", denies residual (05/29/2013)  . BPH (benign prostatic hyperplasia)   . Idiopathic polyneuropathy   . Peripheral neuropathy   . Pneumonia     [name] 02/25/2001 (05/29/2013)  . Bursitis   . Skin cancer     "off face & arms"  (05/29/2013)  . Dementia     Medications:   (Not in a hospital admission) Assessment: 8 YOM presents to ED with an episode of left-sided burning chest pain last about an hour. He is febrile and mildly hypertensive. Starting empiric treatment with vancomycin and cefepime for sepsis. WBC 11.9, Temp 100.9, CrCl~ 33 mL/min Vancomycin 12/16>> Cefepime 12/16>>  Blood Cx x2>> Urine Cx>>   Goal of Therapy:  Vancomycin trough level 15-20 mcg/ml  Plan:  -Vancomycin 1000 mg IV x 1 dose in ED, then 1250 mg IV Q 24 hours  -Cefepime 2g IV Q 24 hours  -F/u CBC, cultures and patient clinical status -Collect vanc trough as appropriate    Vinnie Level, PharmD.  Clinical Pharmacist Pager [phone]

## 2013-12-08 NOTE — ED Notes (Signed)
Pt denies any complaints at this time

## 2013-12-08 NOTE — ED Notes (Signed)
This RN spoke with pt's son Annette Stable. Informed son of pt's condition. Please call with any updates, son lives in [place].

## 2013-12-08 NOTE — ED Notes (Signed)
Pt's bed assignment changed from SDU to telemetry.

## 2013-12-08 NOTE — ED Notes (Signed)
XRAY called to inquire about pt's chest portable XRAY. Phlebotomy called and informed pt needs one more set of blood cultures drawn.

## 2013-12-09 ENCOUNTER — Encounter (HOSPITAL_COMMUNITY): Payer: Self-pay | Admitting: *Deleted

## 2013-12-09 DIAGNOSIS — I2510 Atherosclerotic heart disease of native coronary artery without angina pectoris: Secondary | ICD-10-CM

## 2013-12-09 DIAGNOSIS — A419 Sepsis, unspecified organism: Secondary | ICD-10-CM | POA: Diagnosis present

## 2013-12-09 DIAGNOSIS — J189 Pneumonia, unspecified organism: Secondary | ICD-10-CM

## 2013-12-09 LAB — COMPREHENSIVE METABOLIC PANEL
Albumin: 3.1 g/dL — ABNORMAL LOW (ref 3.5–5.2)
Alkaline Phosphatase: 68 U/L (ref 39–117)
BUN: 35 mg/dL — ABNORMAL HIGH (ref 6–23)
CO2: 25 mEq/L (ref 19–32)
Chloride: 104 mEq/L (ref 96–112)
Creatinine, Ser: 1.47 mg/dL — ABNORMAL HIGH (ref 0.50–1.35)
GFR calc Af Amer: 47 mL/min — ABNORMAL LOW (ref >90)
GFR calc non Af Amer: 40 mL/min — ABNORMAL LOW (ref >90)
Glucose, Bld: 108 mg/dL — ABNORMAL HIGH (ref 70–99)
Potassium: 3.6 mEq/L (ref 3.5–5.1)
Total Bilirubin: 0.4 mg/dL (ref 0.3–1.2)
Total Protein: 6.2 g/dL (ref 6.0–8.3)

## 2013-12-09 LAB — CBC WITH DIFFERENTIAL/PLATELET
Basophils Absolute: 0.1 10*3/uL (ref 0.0–0.1)
Eosinophils Absolute: 0 10*3/uL (ref 0.0–0.7)
Eosinophils Relative: 0 % (ref 0–5)
HCT: 35.4 % — ABNORMAL LOW (ref 39.0–52.0)
Hemoglobin: 12 g/dL — ABNORMAL LOW (ref 13.0–17.0)
Lymphocytes Relative: 13 % (ref 12–46)
Lymphs Abs: 1.2 10*3/uL (ref 0.7–4.0)
MCV: 92.4 fL (ref 78.0–100.0)
Neutrophils Relative %: 71 % (ref 43–77)
Platelets: 215 10*3/uL (ref 150–400)
RDW: 14.9 % (ref 11.5–15.5)
WBC: 9.2 10*3/uL (ref 4.0–10.5)

## 2013-12-09 LAB — INFLUENZA PANEL BY PCR (TYPE A & B)
Influenza A By PCR: NEGATIVE
Influenza B By PCR: NEGATIVE

## 2013-12-09 LAB — URINE CULTURE
Colony Count: NO GROWTH
Culture: NO GROWTH

## 2013-12-09 LAB — MRSA PCR SCREENING: MRSA by PCR: NEGATIVE

## 2013-12-09 MED ORDER — DEXTROSE 5 % IV SOLN
2.0000 g | INTRAVENOUS | Status: DC
  Filled 2013-12-09: qty 2

## 2013-12-09 NOTE — H&P (Signed)
@LOGO @ [facility] History and Physical  Patient: Nathan Willis  WUJ:[id number]  DOB: 1922-06-19  DOS: the patient was seen and examined on 12/08/2013 PCP: Lorretta Harp, MD  Chief Complaint: Cough and shortness of breath  HPI: Nathan Willis is a 79 y.o. male with Past medical history of coronary artery disease, hypertension, CVA, neuropathy. The patient is coming from SNF. The patient presented with the complaints of chest pain initially which was left-sided burning in nature and resolved at present. The patient mentions that he has been having cough since since last one week with cloudy expectoration without any blood. He denies any fever but mentions about chills. He denies any orthopnea or PND or leg swelling. He denies any aspiration. He complains of nausea denies any abdominal pain or vomiting. No diarrhea or constipation or burning urination. He is compliant with his medications.  Review of Systems: as mentioned in the history of present illness.  A Comprehensive review of the other systems is negative.  Past Medical History  Diagnosis Date  . Coronary artery disease   . Hypertension   . Angina at rest   . Hyperlipidemia   . CVA (cerebral infarction)     "at least one since 2011", denies residual (05/29/2013)  . BPH (benign prostatic hyperplasia)   . Idiopathic polyneuropathy   . Peripheral neuropathy   . Pneumonia     [name] 02/25/2001 (05/29/2013)  . Bursitis   . Skin cancer     "off face & arms" (05/29/2013)  . Dementia    Past Surgical History  Procedure Laterality Date  . Appendectomy  1987    [name] 02/25/2001 (05/29/2013)  . Inguinal hernia repair Right   . Coronary artery bypass graft  1997    [name] 02/25/2001 (05/29/2013)  . Transurethral resection of prostate  1993    [name] 02/25/2001 (05/29/2013)  . Coronary angioplasty    . Reconstructive repair sternal  02/2001    sternal rewiring/notes 02/25/2001 (05/29/2013)   Social History:  reports that he has quit  smoking. His smoking use included Cigarettes. He has a 60 pack-year smoking history. He has never used smokeless tobacco. He reports that he does not drink alcohol or use illicit drugs. Independent for most of his  ADL.  Allergies  Allergen Reactions  . Baycol [Cerivastatin] Other (See Comments)    REACTION:  unknown  . Codeine Other (See Comments)    REACTION:  "makes me crazy"  . Lescol [Fluvastatin Sodium] Other (See Comments)    REACTION:  unknown  . Lipitor [Atorvastatin] Other (See Comments)    REACTION:  "makes me violent"  . Morphine And Related Other (See Comments)    REACTION:  "makes me crazy"  . Zetia [Ezetimibe] Other (See Comments)    REACTION:  unknown  . Zocor [Simvastatin] Other (See Comments)    REACTION:  unknown    No family history on file.  Prior to Admission medications   Medication Sig Start Date End Date Taking? Authorizing Provider  acetaminophen (TYLENOL) 325 MG tablet Take 2 tablets (650 mg total) by mouth every 6 (six) hours as needed. 06/02/13  Yes [name] Isaiah Blakes, MD  albuterol (PROVENTIL) (2.5 MG/3ML) 0.083% nebulizer solution Take 2.5 mg by nebulization every 8 (eight) hours as needed for wheezing or shortness of breath.   Yes Historical Provider, MD  aspirin EC 325 MG EC tablet Take 1 tablet (325 mg total) by mouth daily. 06/02/13  Yes [name] Isaiah Blakes, MD  finasteride [facility])  5 MG tablet Take 5 mg by mouth daily.   Yes Historical Provider, MD  furosemide (LASIX) 40 MG tablet Take 40 mg by mouth daily.    Yes Historical Provider, MD  isosorbide mononitrate (IMDUR) 120 MG 24 hr tablet Take 120 mg by mouth daily.   Yes Historical Provider, MD  loperamide (IMODIUM) 2 MG capsule Take 2 mg by mouth as needed for diarrhea or loose stools.   Yes Historical Provider, MD  LORazepam (ATIVAN) 0.5 MG tablet Take 0.5 mg by mouth 2 (two) times daily as needed for anxiety.   Yes Historical Provider, MD  metoprolol succinate (TOPROL-XL) 25 MG 24  hr tablet Take 25 mg by mouth daily.   Yes Historical Provider, MD  Multiple Vitamins-Minerals (DECUBI-VITE) CAPS Take 1 capsule by mouth daily.    Yes Historical Provider, MD  pravastatin (PRAVACHOL) 20 MG tablet Take 20 mg by mouth daily.   Yes Historical Provider, MD    Physical Exam: Filed Vitals:   12/08/13 2120 12/08/13 2146 12/08/13 2252 12/09/13 0457  BP:  107/58 124/63 118/59  Pulse:  59  62  Temp: 97.8 F (36.6 C)  97.5 F (36.4 C) 97.9 F (36.6 C)  TempSrc: Oral  Oral Oral  Resp:  21 19 20   Height:      Weight:   76.4 kg (168 lb 6.9 oz) 76 kg (167 lb 8.8 oz)  SpO2:  96% 97% 96%    General: Alert, Awake and Oriented to Time, Place and Person. Appear in mild distress Eyes: PERRL ENT: Oral Mucosa clear dry. Neck: No JVD Cardiovascular: S1 and S2 Present, aortic systolic Murmur, Peripheral Pulses Present Respiratory: Bilateral Air entry equal and Decreased, faint basal Crackles, no wheezes Abdomen: Bowel Sound Present, Soft and Non tender Skin: No Rash Extremities: No Pedal edema, no calf tenderness Neurologic: Grossly Unremarkable.  Labs on Admission:  CBC:  Recent Labs Lab 12/08/13 1857  WBC 11.9*  NEUTROABS 9.9*  HGB 12.1*  HCT 36.7*  MCV 94.1  PLT 251    CMP     Component Value Date/Time   NA 138 12/08/2013 1857   K 4.1 12/08/2013 1857   CL 101 12/08/2013 1857   CO2 24 12/08/2013 1857   GLUCOSE 142* 12/08/2013 1857   BUN 36* 12/08/2013 1857   CREATININE 1.65* 12/08/2013 1857   CALCIUM 8.8 12/08/2013 1857   PROT 6.6 12/08/2013 1857   ALBUMIN 3.3* 12/08/2013 1857   AST 15 12/08/2013 1857   ALT 10 12/08/2013 1857   ALKPHOS 76 12/08/2013 1857   BILITOT 0.4 12/08/2013 1857   GFRNONAA 35* 12/08/2013 1857   GFRAA 41* 12/08/2013 1857    No results found for this basename: LIPASE, AMYLASE,  in the last 168 hours No results found for this basename: AMMONIA,  in the last 168 hours  No results found for this basename: CKTOTAL, CKMB, CKMBINDEX,  TROPONINI,  in the last 168 hours BNP (last 3 results) No results found for this basename: PROBNP,  in the last 8760 hours  Radiological Exams on Admission: Dg Chest Port 1 View  (if Code Sepsis Called)  12/08/2013   CLINICAL DATA:  Congestion  EXAM: PORTABLE CHEST - 1 VIEW  COMPARISON:  05/30/2013  FINDINGS: Cardiac shadow is stable but mildly enlarged. Postsurgical changes are again seen. The right lung is clear. A small left-sided pleural effusion and left basilar changes are noted.  IMPRESSION: Left effusion with basilar infiltrate.   Electronically Signed   By: Alcide Clever  M.D.   On: 12/08/2013 19:42   Assessment/Plan Principal Problem:   HCAP (healthcare-associated pneumonia) Active Problems:   Hypertension   CVA (cerebral infarction)   CKD (chronic kidney disease) stage 3, GFR 30-59 ml/min   Hyperlipidemia   1. HCAP (healthcare-associated pneumonia) and sepsis The patient is presenting with has consented pneumonia with findings on chest x-ray suggestive of associated pleural effusion as well. He does have history of pleural effusions in the past with atelectasis. He has been febrile in the hospital as well as hypotensive with tachycardia does meets criteria for sepsis His blood pressure has improved with IV fluids. At present I would continue him on IV fluids and admitted to the telemetry floor. He would be continued treated with IV cefepime and vancomycin  At present I would hold off his antihypertensives. Blood culture and urine culture are obtained sputum culture is ordered urine antigens is pending  2. Dyslipidemia Patient is on protocol and is allergic to simvastatin therefore I would hold off medication  3. Coronary artery disease Continue aspirin  DVT Prophylaxis: subcutaneous Heparin Nutrition: Advance as tolerated  Code Status: Full  Disposition: Admitted to inpatient in telemetry unit.  Author: Lynden Oxford, MD [facility] Pager: [phone]  If  7PM-7AM, please contact night-coverage [email or web address] Password [email or web address]

## 2013-12-09 NOTE — Evaluation (Signed)
Clinical/Bedside Swallow Evaluation Patient Details  Name: Nathan Willis MRN: [id number] Date of Birth: 1922-08-08  Today's Date: 12/09/2013 Time: 4098-1191 SLP Time Calculation (min): 36 min  Past Medical History:  Past Medical History  Diagnosis Date  . Coronary artery disease   . Hypertension   . Angina at rest   . Hyperlipidemia   . CVA (cerebral infarction)     "at least one since 2011", denies residual (05/29/2013)  . BPH (benign prostatic hyperplasia)   . Idiopathic polyneuropathy   . Peripheral neuropathy   . Pneumonia     [name] 02/25/2001 (05/29/2013)  . Bursitis   . Skin cancer     "off face & arms" (05/29/2013)  . Dementia    Past Surgical History:  Past Surgical History  Procedure Laterality Date  . Appendectomy  1987    [name] 02/25/2001 (05/29/2013)  . Inguinal hernia repair Right   . Coronary artery bypass graft  1997    [name] 02/25/2001 (05/29/2013)  . Transurethral resection of prostate  1993    [name] 02/25/2001 (05/29/2013)  . Coronary angioplasty    . Reconstructive repair sternal  02/2001    sternal rewiring/notes 02/25/2001 (05/29/2013)   HPI:  79 yo male adm to [facility] with AMS, found to have pna.  Pt resides at [facility] and has h/o pna, cva, dementia, CAD, pna.  Pt CXR showed left pleural effusion and bilbasilar infiltrate.  Per chart review, pt complained of burning in his chest for approx one hour prior to admission.     Assessment / Plan / Recommendation Clinical Impression  Pt presents with clinical indications of moderate oral and possible pharyngeal dysphagia likely due to dementia/cognition.  Pt had minimal oral residuals from breakfast (eggs, grits) without awareness prior to SLP providing intake.  Administration of liquids faciliated clearance, however pt also with minimal oral stasis of secretions.  Cued swallow aided clearance.  Pt required total assistance to help feed - using hand-over-hand.  Slow mastication/delayed oral transiting noted and suspected  delayed pharyngeal swallow but no s/s of aspiration.  Pt did require verbal cue to swallow x1 - self feeding with assistance decreased incidence of oral holding.    Rec to modify diet to ground meats and pt to have full supervision with strict aspiration precautions for maximal airway protection.  SLP to follow up x1 for tolerance and to assure no indication for instrumental testing.  No family present to educate but SLP educated pt to findings, recommendations but his cognition impairs comprehension.      Aspiration Risk  Moderate    Diet Recommendation Thin liquid;Regular (ground meats)   Medication Administration: Crushed with puree Supervision: Full supervision/cueing for compensatory strategies;Staff to assist with self feeding Compensations: Slow rate;Small sips/bites;Check for pocketing Postural Changes and/or Swallow Maneuvers: Seated upright 90 degrees    Other  Recommendations Oral Care Recommendations: Oral care BID   Follow Up Recommendations       Frequency and Duration min 1 x/week  1 week   Pertinent Vitals/Pain Afebrile, decreased    SLP Swallow Goals     Swallow Study Prior Functional Status    see hhx    General HPI: 79 yo male adm to [facility] with AMS, found to have pna.  Pt resides at [facility] and has h/o pna, cva, dementia, CAD, pna.  Pt CXR showed left pleural effusion and bilbasilar infiltrate.  Per chart review, pt complained of burning in his chest for approx one hour prior to admission.  Type of Study: Bedside swallow evaluation Previous Swallow Assessment: none Diet Prior to this Study: Regular;Thin liquids Temperature Spikes Noted: No Respiratory Status: Room air History of Recent Intubation: No Behavior/Cognition: Alert;Cooperative;Doesn't follow directions;Decreased sustained attention (delayed responses due to cognitive issue) Oral Cavity - Dentition: Dentures, top;Dentures, bottom Self-Feeding Abilities: Total assist (helps with hand over  hand) Patient Positioning: Upright in chair Baseline Vocal Quality: Clear Volitional Cough: Cognitively unable to elicit Volitional Swallow: Unable to elicit    Oral/Motor/Sensory Function Overall Oral Motor/Sensory Function: Appears within functional limits for tasks assessed (no focal cranial nerve deficits, pt with gross weakness however) Velum:  (sluggish) Mandible:  (weak)   Ice Chips Ice chips: Impaired Presentation: Spoon Oral Phase Impairments: Reduced lingual movement/coordination;Impaired anterior to posterior transit Oral Phase Functional Implications: Prolonged oral transit Pharyngeal Phase Impairments: Suspected delayed Swallow   Thin Liquid Thin Liquid: Impaired Presentation: Cup;Straw;Spoon Oral Phase Impairments: Impaired anterior to posterior transit;Reduced lingual movement/coordination Oral Phase Functional Implications: Prolonged oral transit Pharyngeal  Phase Impairments: Suspected delayed Swallow;Multiple swallows    Nectar Thick Nectar Thick Liquid: Impaired Oral Phase Impairments: Reduced lingual movement/coordination;Impaired anterior to posterior transit Oral phase functional implications: Prolonged oral transit Pharyngeal Phase Impairments: Suspected delayed Swallow;Multiple swallows   Honey Thick Honey Thick Liquid: Not tested   Puree Puree: Impaired Presentation: Spoon;Self Fed Oral Phase Impairments: Impaired anterior to posterior transit;Reduced lingual movement/coordination Oral Phase Functional Implications: Prolonged oral transit Pharyngeal Phase Impairments: Suspected delayed Swallow   Solid   GO    Solid: Impaired Presentation: Self Fed Oral Phase Impairments: Impaired anterior to posterior transit;Reduced lingual movement/coordination Pharyngeal Phase Impairments: Suspected delayed Fredric Mare, MS [facility] SLP [phone]

## 2013-12-09 NOTE — Progress Notes (Signed)
[facility] PROGRESS NOTE   Nathan Willis:[id number] DOB: 02-Aug-1922 DOA: 12/08/2013 PCP: Lorretta Harp, MD  Brief narrative: Nathan Willis is an 79 y.o. male with PMH of CAD, hypertension, prior stroke and neuropathy who was admitted on 12/09/2013 from his SNF with HCAP.  Assessment/Plan: Principal Problem:   Sepsis secondary to HCAP (healthcare-associated pneumonia) The patient was admitted and put on IV fluids secondary to hypotension. He is being treated with empiric cefepime and vancomycin. Blood cultures and sputum cultures were obtained.  Influenza panel negative. Followup legionella/strep pneumonia antigens. Active Problems:   Hypertension Toprol and Lasix as well as Imdur on hold secondary to low blood pressure.   CVA (cerebral infarction) Control modifiable risk factors.  Continue aspirin.   CKD (chronic kidney disease) stage 3, GFR 30-59 ml/min Creatinine improved with IV fluids overnight.   Hyperlipidemia Pravachol on hold. Patient allergic to simvastatin which the pharmacy substitutes for Pravachol as Pravachol is not on formulary here.  Code Status: Full. Family Communication: No family at the bedside. Disposition Plan: SNF.   IV access:  Peripheral IV  Medical Consultants:  None.  Other Consultants:  None.  Anti-infectives: Cefepime 12/08/2013---> Vancomycin 12/08/2013--->  HPI/Subjective: Nathan Willis denies dyspnea, myalgias, chest pain, cough.  No fever or chills.  Denies feeling tired or weak.    Objective: Filed Vitals:   12/08/13 2120 12/08/13 2146 12/08/13 2252 12/09/13 0457  BP:  107/58 124/63 118/59  Pulse:  59  62  Temp: 97.8 F (36.6 C)  97.5 F (36.4 C) 97.9 F (36.6 C)  TempSrc: Oral  Oral Oral  Resp:  21 19 20   Height:      Weight:   76.4 kg (168 lb 6.9 oz) 76 kg (167 lb 8.8 oz)  SpO2:  96% 97% 96%    Intake/Output Summary (Last 24 hours) at 12/09/13 0730 Last data filed at 12/09/13 0605  Gross per  24 hour  Intake   2100 ml  Output      0 ml  Net   2100 ml    Exam: Gen:  NAD Cardiovascular:  RRR, No M/R/G Respiratory:  Lungs course with scattered rhonchi Gastrointestinal:  Abdomen soft, NT/ND, + BS Extremities:  No C/E/C  Data Reviewed: Basic Metabolic Panel:  Recent Labs Lab 12/08/13 1857 12/09/13 0340  NA 138 139  K 4.1 3.6  CL 101 104  CO2 24 25  GLUCOSE 142* 108*  BUN 36* 35*  CREATININE 1.65* 1.47*  CALCIUM 8.8 8.2*   GFR Estimated Creatinine Clearance: 36.6 ml/min (by C-G formula based on Cr of 1.47). Liver Function Tests:  Recent Labs Lab 12/08/13 1857 12/09/13 0340  AST 15 16  ALT 10 10  ALKPHOS 76 68  BILITOT 0.4 0.4  PROT 6.6 6.2  ALBUMIN 3.3* 3.1*   CBC:  Recent Labs Lab 12/08/13 1857 12/09/13 0340  WBC 11.9* 9.2  NEUTROABS 9.9* 6.5  HGB 12.1* 12.0*  HCT 36.7* 35.4*  MCV 94.1 92.4  PLT 251 215   Microbiology Recent Results (from the past 240 hour(s))  MRSA PCR SCREENING     Status: None   Collection Time    12/08/13 11:18 PM      Result Value Range Status   MRSA by PCR NEGATIVE  NEGATIVE Final   Comment:            The GeneXpert MRSA Assay (FDA     approved for NASAL specimens     only), is one component of a  comprehensive MRSA colonization     surveillance program. It is not     intended to diagnose MRSA     infection nor to guide or     monitor treatment for     MRSA infections.     Procedures and Diagnostic Studies: Dg Chest Port 1 View  (if Code Sepsis Called)  12/08/2013   CLINICAL DATA:  Congestion  EXAM: PORTABLE CHEST - 1 VIEW  COMPARISON:  05/30/2013  FINDINGS: Cardiac shadow is stable but mildly enlarged. Postsurgical changes are again seen. The right lung is clear. A small left-sided pleural effusion and left basilar changes are noted.  IMPRESSION: Left effusion with basilar infiltrate.   Electronically Signed   By: Alcide Clever M.D.   On: 12/08/2013 19:42    Scheduled Meds: . aspirin EC  325 mg Oral  Daily  . ceFEPime (MAXIPIME) IV  1 g Intravenous Q8H  . ceFEPime (MAXIPIME) IV  2 g Intravenous Q24H  . finasteride  5 mg Oral Daily  . heparin  5,000 Units Subcutaneous Q8H  . vancomycin  1,250 mg Intravenous Q24H   Continuous Infusions: . sodium chloride 100 mL/hr at 12/08/13 2305    Time spent: 25 minutes.    LOS: 1 day   Jaimere Feutz  [facility] Pager [phone].   *Please note that the hospitalists switch teams on Wednesdays. Please call the flow manager at [phone] if you are having difficulty reaching the hospitalist taking care of this patient as she can update you and provide the most up-to-date pager number of provider caring for the patient. If 8PM-8AM, please contact night-coverage at [email or web address], password [facility]  12/09/2013, 7:30 AM

## 2013-12-09 NOTE — H&P (Signed)
Patient unable to complete history at this time

## 2013-12-09 NOTE — Progress Notes (Signed)
UR completed Alaja Goldinger K. Jamee Pacholski, RN, BSN, MSHL, CCM  12/09/2013 3:15 PM

## 2013-12-09 NOTE — Progress Notes (Signed)
Admitted pt from ED per stretcher, alert oriented to self, place and situation but disoriented to time. Denies any chest pain, no nausea and vomiting, not in respiratory distress. Put on continous telemetry ( Afib on the monitor with HR 80-100) Put on droplet precaution to r/o Influenza. Nasal swab done for MRSA PCR per protocol and Influenza antigen as ordered. Started on IVF NSS running at 100 ml/hr as ordered. Oriented to room and call bell. Put the bed alarm on. Will continue to monitor pt

## 2013-12-10 ENCOUNTER — Emergency Department (HOSPITAL_COMMUNITY)
Admission: EM | Admit: 2013-12-10 | Discharge: 2013-12-11 | Disposition: A | Discharge status: Skilled Nursing Facility | Payer: Medicare Other | Attending: Emergency Medicine | Admitting: Emergency Medicine

## 2013-12-10 DIAGNOSIS — F0390 Unspecified dementia without behavioral disturbance: Secondary | ICD-10-CM | POA: Insufficient documentation

## 2013-12-10 DIAGNOSIS — I209 Angina pectoris, unspecified: Secondary | ICD-10-CM | POA: Insufficient documentation

## 2013-12-10 DIAGNOSIS — I10 Essential (primary) hypertension: Secondary | ICD-10-CM | POA: Insufficient documentation

## 2013-12-10 DIAGNOSIS — Z79899 Other long term (current) drug therapy: Secondary | ICD-10-CM | POA: Insufficient documentation

## 2013-12-10 DIAGNOSIS — Z792 Long term (current) use of antibiotics: Secondary | ICD-10-CM | POA: Insufficient documentation

## 2013-12-10 DIAGNOSIS — N400 Enlarged prostate without lower urinary tract symptoms: Secondary | ICD-10-CM | POA: Insufficient documentation

## 2013-12-10 DIAGNOSIS — I959 Hypotension, unspecified: Secondary | ICD-10-CM

## 2013-12-10 DIAGNOSIS — J189 Pneumonia, unspecified organism: Secondary | ICD-10-CM

## 2013-12-10 DIAGNOSIS — Z85828 Personal history of other malignant neoplasm of skin: Secondary | ICD-10-CM | POA: Insufficient documentation

## 2013-12-10 DIAGNOSIS — Z7982 Long term (current) use of aspirin: Secondary | ICD-10-CM | POA: Insufficient documentation

## 2013-12-10 DIAGNOSIS — I2510 Atherosclerotic heart disease of native coronary artery without angina pectoris: Secondary | ICD-10-CM | POA: Insufficient documentation

## 2013-12-10 DIAGNOSIS — J159 Unspecified bacterial pneumonia: Secondary | ICD-10-CM | POA: Insufficient documentation

## 2013-12-10 DIAGNOSIS — Z951 Presence of aortocoronary bypass graft: Secondary | ICD-10-CM | POA: Insufficient documentation

## 2013-12-10 DIAGNOSIS — Z87891 Personal history of nicotine dependence: Secondary | ICD-10-CM | POA: Insufficient documentation

## 2013-12-10 DIAGNOSIS — N179 Acute kidney failure, unspecified: Secondary | ICD-10-CM

## 2013-12-10 DIAGNOSIS — E876 Hypokalemia: Secondary | ICD-10-CM | POA: Diagnosis present

## 2013-12-10 DIAGNOSIS — Z8673 Personal history of transient ischemic attack (TIA), and cerebral infarction without residual deficits: Secondary | ICD-10-CM | POA: Insufficient documentation

## 2013-12-10 DIAGNOSIS — E785 Hyperlipidemia, unspecified: Secondary | ICD-10-CM | POA: Insufficient documentation

## 2013-12-10 LAB — CBC
HCT: 32.5 % — ABNORMAL LOW (ref 39.0–52.0)
MCHC: 34.2 g/dL (ref 30.0–36.0)
MCV: 92.6 fL (ref 78.0–100.0)
Platelets: 180 10*3/uL (ref 150–400)
RBC: 3.51 MIL/uL — ABNORMAL LOW (ref 4.22–5.81)
RDW: 14.9 % (ref 11.5–15.5)
WBC: 10.5 10*3/uL (ref 4.0–10.5)

## 2013-12-10 LAB — BASIC METABOLIC PANEL
BUN: 28 mg/dL — ABNORMAL HIGH (ref 6–23)
CO2: 23 mEq/L (ref 19–32)
Calcium: 7.9 mg/dL — ABNORMAL LOW (ref 8.4–10.5)
Creatinine, Ser: 1.37 mg/dL — ABNORMAL HIGH (ref 0.50–1.35)
GFR calc Af Amer: 51 mL/min — ABNORMAL LOW (ref >90)
GFR calc non Af Amer: 44 mL/min — ABNORMAL LOW (ref >90)
Sodium: 137 mEq/L (ref 135–145)

## 2013-12-10 MED ORDER — LEVOFLOXACIN 500 MG PO TABS
500.0000 mg | ORAL_TABLET | Freq: Every day | ORAL | Status: DC
  Administered 2013-12-10: 12:00:00 500 mg via ORAL
  Filled 2013-12-10: qty 1

## 2013-12-10 MED ORDER — POTASSIUM CHLORIDE IN NACL 40-0.9 MEQ/L-% IV SOLN
INTRAVENOUS | Status: DC
  Administered 2013-12-10: 08:00:00 via INTRAVENOUS
  Filled 2013-12-10 (×4): qty 1000

## 2013-12-10 MED ORDER — LEVOFLOXACIN 750 MG PO TABS
750.0000 mg | ORAL_TABLET | ORAL | Status: DC
  Administered 2013-12-10: 16:00:00 750 mg via ORAL
  Filled 2013-12-10: qty 1

## 2013-12-10 MED ORDER — LEVOFLOXACIN 750 MG PO TABS: 750.0000 mg | ORAL_TABLET | ORAL | Status: DC

## 2013-12-10 NOTE — Progress Notes (Signed)
Pt is being discharged back to [facility]. Ambulance will be transporting the patient back to [facility]. Lupita Leash, the social worker has put the discharge packet together. Will continue to monitor

## 2013-12-10 NOTE — Progress Notes (Signed)
[facility] PROGRESS NOTE   Nathan Willis ZOX:[id number] DOB: 23-Oct-1922 DOA: 12/08/2013 PCP: Lorretta Harp, MD  Brief narrative: Nathan Willis is an 79 y.o. male with PMH of CAD, hypertension, prior stroke and neuropathy who was admitted on 12/09/2013 from his SNF with suspected HCAP. Patient appears to be clinically stable, so his antibiotic coverage was narrowed on 12/10/2013.  Assessment/Plan: Principal Problem:   Sepsis secondary to HCAP (healthcare-associated pneumonia) The patient was admitted and put on IV fluids secondary to hypotension. He is being treated with empiric cefepime and vancomycin. Initial chest x-ray did show an infiltrate and effusion of the left base.  We'll narrow to Levaquin today, the patient is clinically stable, afebrile and without leukocytosis. Blood cultures and sputum cultures were obtained.  Influenza panel negative. Followup legionella/strep pneumonia antigens. Active Problems:   Hypokalemia We'll add potassium to IV fluids.   Hypertension Toprol and Lasix as well as Imdur on hold secondary to low blood pressure. Blood pressure stable today.   CVA (cerebral infarction) Control modifiable risk factors.  Continue aspirin.   CKD (chronic kidney disease) stage 3, GFR 30-59 ml/min Creatinine continues to slowly improve with IV fluids.   Hyperlipidemia Pravachol on hold. Patient allergic to simvastatin which the pharmacy substitutes for Pravachol as Pravachol is not on formulary here.  Code Status: Full. Family Communication: No family at the bedside. Disposition Plan: SNF.   IV access:  Peripheral IV  Medical Consultants:  None.  Other Consultants:  None.  Anti-infectives: Cefepime 12/08/2013---> Vancomycin 12/08/2013--->  HPI/Subjective: Nathan Willis continues to be asymptomatic with no complaints of dyspnea, cough, lethargy, pain, nausea, vomiting or diarrhea.    Objective: Filed Vitals:   12/09/13 1300 12/09/13  2034 12/09/13 2148 12/10/13 0538  BP: 137/84 100/56  125/51  Pulse: 88 77 66 85  Temp: 97.4 F (36.3 C) 97.6 F (36.4 C)  98.4 F (36.9 C)  TempSrc: Oral Oral  Oral  Resp: 18 24 20 20   Height:      Weight:    76.8 kg (169 lb 5 oz)  SpO2: 96% 95%  93%    Intake/Output Summary (Last 24 hours) at 12/10/13 0708 Last data filed at 12/10/13 0000  Gross per 24 hour  Intake 2461.67 ml  Output      0 ml  Net 2461.67 ml    Exam: Gen:  NAD Cardiovascular:  RRR, No M/R/G Respiratory:  Lungs diminished Gastrointestinal:  Abdomen soft, NT/ND, + BS Extremities:  Trace edema  Data Reviewed: Basic Metabolic Panel:  Recent Labs Lab 12/08/13 1857 12/09/13 0340 12/10/13 0330  NA 138 139 137  K 4.1 3.6 3.0*  CL 101 104 105  CO2 24 25 23   GLUCOSE 142* 108* 88  BUN 36* 35* 28*  CREATININE 1.65* 1.47* 1.37*  CALCIUM 8.8 8.2* 7.9*   GFR Estimated Creatinine Clearance: 39.7 ml/min (by C-G formula based on Cr of 1.37). Liver Function Tests:  Recent Labs Lab 12/08/13 1857 12/09/13 0340  AST 15 16  ALT 10 10  ALKPHOS 76 68  BILITOT 0.4 0.4  PROT 6.6 6.2  ALBUMIN 3.3* 3.1*   CBC:  Recent Labs Lab 12/08/13 1857 12/09/13 0340 12/10/13 0330  WBC 11.9* 9.2 10.5  NEUTROABS 9.9* 6.5  --   HGB 12.1* 12.0* 11.1*  HCT 36.7* 35.4* 32.5*  MCV 94.1 92.4 92.6  PLT 251 215 180   Microbiology Recent Results (from the past 240 hour(s))  URINE CULTURE     Status: None  Collection Time    12/08/13  8:06 PM      Result Value Range Status   Specimen Description URINE, RANDOM   Final   Special Requests NONE   Final   Culture  Setup Time     Final   Value: 12/08/2013 21:42     Performed at [facility] Count     Final   Value: NO GROWTH     Performed at [facility]   Culture     Final   Value: NO GROWTH     Performed at [facility]   Report Status 12/09/2013 FINAL   Final  MRSA PCR SCREENING     Status: None   Collection Time    12/08/13  11:18 PM      Result Value Range Status   MRSA by PCR NEGATIVE  NEGATIVE Final   Comment:            The GeneXpert MRSA Assay (FDA     approved for NASAL specimens     only), is one component of a     comprehensive MRSA colonization     surveillance program. It is not     intended to diagnose MRSA     infection nor to guide or     monitor treatment for     MRSA infections.     Procedures and Diagnostic Studies: Dg Chest Port 1 View  (if Code Sepsis Called)  12/08/2013   CLINICAL DATA:  Congestion  EXAM: PORTABLE CHEST - 1 VIEW  COMPARISON:  05/30/2013  FINDINGS: Cardiac shadow is stable but mildly enlarged. Postsurgical changes are again seen. The right lung is clear. A small left-sided pleural effusion and left basilar changes are noted.  IMPRESSION: Left effusion with basilar infiltrate.   Electronically Signed   By: Alcide Clever M.D.   On: 12/08/2013 19:42    Scheduled Meds: . aspirin EC  325 mg Oral Daily  . ceFEPime (MAXIPIME) IV  2 g Intravenous Q24H  . finasteride  5 mg Oral Daily  . heparin  5,000 Units Subcutaneous Q8H  . vancomycin  1,250 mg Intravenous Q24H   Continuous Infusions: . sodium chloride 100 mL/hr at 12/10/13 0510    Time spent: 25 minutes.    LOS: 2 days   RAMA,CHRISTINA  [facility] Pager [phone].   *Please note that the hospitalists switch teams on Wednesdays. Please call the flow manager at [phone] if you are having difficulty reaching the hospitalist taking care of this patient as she can update you and provide the most up-to-date pager number of provider caring for the patient. If 8PM-8AM, please contact night-coverage at [email or web address], password [email or web address]  12/10/2013, 7:08 AM

## 2013-12-10 NOTE — Progress Notes (Addendum)
Speech Language Pathology Treatment: Dysphagia  Patient Details Name: EILAN MCINERNY MRN: [id number] DOB: December 10, 1922 Today's Date: 12/10/2013 Time: 4098-1191 SLP Time Calculation (min): 12 min  Assessment / Plan / Recommendation Clinical Impression  Pt with improved mental status today resulting in improved swallowing ability.  Pt able to self feed peaches, applesauce and water without oral residuals today nor s/s of aspiration.  Pt continues with retained oral secretions with anterior spillage.  Pt reports he has always had issues with "drooling" his "whole life" but unable to state medical reasoning for it.  SLP educated pt to importance of oral care and provided written/verbal reminder to swallow saliva - posting sign in room.   Cough x1 noted - after snack completed - ? If pt may have mild aspiration of secretions- although he denies.     Recommend to continue diet with ground meats and intermittent supervision for maximal airway protection.  No further SLP indicated as all education completed and pt appears to be tolerating diet.  Pt agree to plan and today denies he reported h/o coughing with intake (as verbalized yesterday).     HPI HPI: 79 yo male adm to [facility] with AMS, found to have pna.  Pt resides at [facility] and has h/o pna, cva, dementia, CAD, pna.  Pt CXR showed left pleural effusion and bilbasilar infiltrate.  Per chart review, pt complained of burning in his chest for approx one hour prior to admission.  Pt seen for BSE yesterday and as found to have mild dysphagia, today seen to assess tolerance of po diet and for pt/family education.     Pertinent Vitals Afebrile, decreased  SLP Plan  All goals met    Recommendations Diet recommendations: Regular (continue ground meats) Liquids provided via: Straw;Cup Medication Administration: Whole meds with puree (whole with puree, follow with liquids) Supervision: Patient able to self feed;Intermittent supervision to cue for  compensatory strategies Compensations: Slow rate;Small sips/bites;Check for pocketing (cue pt to swallow secretions) Postural Changes and/or Swallow Maneuvers: Seated upright 90 degrees;Out of bed for meals              Oral Care Recommendations: Oral care BID Follow up Recommendations: None Plan: All goals met    GO  Donavan Burnet, MS [facility] SLP [phone]   RN informed of recommendations and findings.

## 2013-12-10 NOTE — Discharge Summary (Signed)
Physician Discharge Summary  Nathan Willis:[id number] DOB: 06-09-1922 DOA: 12/08/2013  PCP: Lorretta Harp, MD  Admit date: 12/08/2013 Discharge date: 12/10/2013  Recommendations for Outpatient Follow-up:  1. Close F/U with PCP recommended. 2. Followup final blood culture results which are negative to date. 3. Note: Lasix discontinued secondary to low blood pressures. Consider resumption when blood pressure consistently elevated. 4. Hold metoprolol for systolic blood pressure less than 95 or pulse less than 60. 5. Repeat BMET and 3-4 days to ensure potassium normal creatinine back to baseline values.  Discharge Diagnoses:  Principal Problem:    Sepsis secondary to mild HCAP versus aspiration PNA Active Problems:    Hypertension    CVA (cerebral infarction)    CKD (chronic kidney disease) stage 3, GFR 30-59 ml/min    Hyperlipidemia    HCAP (healthcare-associated pneumonia)    Hypokalemia  Discharge Condition: Stable.  Diet recommendation: Low-sodium, heart healthy with ground meat. Medications should be administered whole with pure.  History of present illness:  Nathan Willis is an 79 y.o. male with PMH of CAD, hypertension, prior stroke and neuropathy who was admitted on 12/09/2013 from his SNF with suspected HCAP. Patient appears to be clinically stable, so his antibiotic coverage was narrowed on 12/10/2013.  Hospital Course by problem:  Principal Problem:  Sepsis secondary to HCAP (healthcare-associated pneumonia)  The patient was admitted and put on IV fluids secondary to hypotension. He was initially treated with empiric cefepime and vancomycin. Initial chest x-ray did show an infiltrate and effusion of the left base. Since the patient was largely asymptomatic and without fever or leukocytosis while in the hospital, his antibiotics were narrowed to Levaquin today. Blood cultures were obtained and are pending. Influenza panel negative. Active Problems:   Hypokalemia  Potassium added to IV fluids. May need additional supplementation of Lasix resumed. Hypertension  Toprol and Lasix as well as Imdur on hold secondary to low blood pressure. Blood pressure stable today, the date of discharge. Okay to resume Toprol and Imdur at discharge. Would continue to hold Lasix. CVA (cerebral infarction)  Control modifiable risk factors. Continue aspirin.  CKD (chronic kidney disease) stage 3, GFR 30-59 ml/min  Creatinine continues to slowly improve with IV fluids.  Hyperlipidemia  Pravachol on hold. Patient allergic to simvastatin which the pharmacy substitutes for Pravachol as Pravachol is not on formulary here.  Procedures:  None.  Consultations:  None.  Discharge Exam: Filed Vitals:   12/10/13 0538  BP: 125/51  Pulse: 85  Temp: 98.4 F (36.9 C)  Resp: 20   Filed Vitals:   12/09/13 1300 12/09/13 2034 12/09/13 2148 12/10/13 0538  BP: 137/84 100/56  125/51  Pulse: 88 77 66 85  Temp: 97.4 F (36.3 C) 97.6 F (36.4 C)  98.4 F (36.9 C)  TempSrc: Oral Oral  Oral  Resp: 18 24 20 20   Height:      Weight:    76.8 kg (169 lb 5 oz)  SpO2: 96% 95%  93%    Gen:  NAD Cardiovascular:  RRR, No M/R/G Respiratory: Lungs diminished Gastrointestinal: Abdomen soft, NT/ND with normal active bowel sounds. Extremities: Trace pedal edema   Discharge Instructions  Discharge Orders   Future Appointments Provider Department Dept Phone   02/11/2014 10:45 AM Max Maud Deed, DPM [facility] at [facility] [phone]   Future Orders Complete By Expires   Call MD for:  extreme fatigue  As directed    Call MD for:  persistant nausea and vomiting  As directed    Call MD for:  temperature >100.4  As directed    Diet - low sodium heart healthy  As directed    Discharge instructions  As directed    Comments:     You were cared for by Dr. Hillery Aldo  (a hospitalist) during your hospital stay. If you have any questions about your discharge  medications or the care you received while you were in the hospital after you are discharged, you can call the unit and ask to speak with the hospitalist on call if the hospitalist that took care of you is not available. Once you are discharged, your primary care physician will handle any further medical issues. Please note that NO REFILLS for any discharge medications will be authorized once you are discharged, as it is imperative that you return to your primary care physician (or establish a relationship with a primary care physician if you do not have one) for your aftercare needs so that they can reassess your need for medications and monitor your lab values.  Any outstanding tests can be reviewed by your PCP at your follow up visit.  It is also important to review any medicine changes with your PCP.  Please bring these d/c instructions with you to your next visit so your physician can review these changes with you.  If you do not have a primary care physician, you can call [phone] for a physician referral.  It is highly recommended that you obtain a PCP for hospital follow up.   Increase activity slowly  As directed    Walk with assistance  As directed    Walker   As directed        Medication List         acetaminophen 325 MG tablet  Commonly known as:  TYLENOL  Take 2 tablets (650 mg total) by mouth every 6 (six) hours as needed.     albuterol (2.5 MG/3ML) 0.083% nebulizer solution  Commonly known as:  PROVENTIL  Take 2.5 mg by nebulization every 8 (eight) hours as needed for wheezing or shortness of breath.     aspirin 325 MG EC tablet  Take 1 tablet (325 mg total) by mouth daily.     DECUBI-VITE Caps  Take 1 capsule by mouth daily.     finasteride 5 MG tablet  Commonly known as:  PROSCAR  Take 5 mg by mouth daily.     furosemide 40 MG tablet  Commonly known as:  LASIX  Take 40 mg by mouth daily.     isosorbide mononitrate 120 MG 24 hr tablet  Commonly known as:  IMDUR   Take 120 mg by mouth daily.     levofloxacin 750 MG tablet  Commonly known as:  LEVAQUIN  Take 1 tablet (750 mg total) by mouth every other day.     loperamide 2 MG capsule  Commonly known as:  IMODIUM  Take 2 mg by mouth as needed for diarrhea or loose stools.     LORazepam 0.5 MG tablet  Commonly known as:  ATIVAN  Take 0.5 mg by mouth 2 (two) times daily as needed for anxiety.     metoprolol succinate 25 MG 24 hr tablet  Commonly known as:  TOPROL-XL  Take 25 mg by mouth daily.     pravastatin 20 MG tablet  Commonly known as:  PRAVACHOL  Take 20 mg by mouth daily.          The results  of significant diagnostics from this hospitalization (including imaging, microbiology, ancillary and laboratory) are listed below for reference.    Significant Diagnostic Studies: Dg Chest Port 1 View  (if Code Sepsis Called)  12/08/2013   CLINICAL DATA:  Congestion  EXAM: PORTABLE CHEST - 1 VIEW  COMPARISON:  05/30/2013  FINDINGS: Cardiac shadow is stable but mildly enlarged. Postsurgical changes are again seen. The right lung is clear. A small left-sided pleural effusion and left basilar changes are noted.  IMPRESSION: Left effusion with basilar infiltrate.   Electronically Signed   By: Alcide Clever M.D.   On: 12/08/2013 19:42    Labs:  Basic Metabolic Panel:  Recent Labs Lab 12/08/13 1857 12/09/13 0340 12/10/13 0330  NA 138 139 137  K 4.1 3.6 3.0*  CL 101 104 105  CO2 24 25 23   GLUCOSE 142* 108* 88  BUN 36* 35* 28*  CREATININE 1.65* 1.47* 1.37*  CALCIUM 8.8 8.2* 7.9*   GFR Estimated Creatinine Clearance: 39.7 ml/min (by C-G formula based on Cr of 1.37). Liver Function Tests:  Recent Labs Lab 12/08/13 1857 12/09/13 0340  AST 15 16  ALT 10 10  ALKPHOS 76 68  BILITOT 0.4 0.4  PROT 6.6 6.2  ALBUMIN 3.3* 3.1*   CBC:  Recent Labs Lab 12/08/13 1857 12/09/13 0340 12/10/13 0330  WBC 11.9* 9.2 10.5  NEUTROABS 9.9* 6.5  --   HGB 12.1* 12.0* 11.1*  HCT 36.7*  35.4* 32.5*  MCV 94.1 92.4 92.6  PLT 251 215 180   Microbiology Recent Results (from the past 240 hour(s))  CULTURE, BLOOD (ROUTINE X 2)     Status: None   Collection Time    12/08/13  7:25 PM      Result Value Range Status   Specimen Description BLOOD WRIST RIGHT   Final   Special Requests BOTTLES DRAWN AEROBIC AND ANAEROBIC 10CC   Final   Culture  Setup Time     Final   Value: 12/09/2013 01:05     Performed at [facility]   Culture     Final   Value:        BLOOD CULTURE RECEIVED NO GROWTH TO DATE CULTURE WILL BE HELD FOR 5 DAYS BEFORE ISSUING A FINAL NEGATIVE REPORT     Performed at [facility]   Report Status PENDING   Incomplete  CULTURE, BLOOD (ROUTINE X 2)     Status: None   Collection Time    12/08/13  7:54 PM      Result Value Range Status   Specimen Description BLOOD ARM LEFT   Final   Special Requests     Final   Value: BOTTLES DRAWN AEROBIC AND ANAEROBIC 10CCBLUE 6CCRED   Culture  Setup Time     Final   Value: 12/09/2013 01:05     Performed at [facility]   Culture     Final   Value:        BLOOD CULTURE RECEIVED NO GROWTH TO DATE CULTURE WILL BE HELD FOR 5 DAYS BEFORE ISSUING A FINAL NEGATIVE REPORT     Performed at [facility]   Report Status PENDING   Incomplete  URINE CULTURE     Status: None   Collection Time    12/08/13  8:06 PM      Result Value Range Status   Specimen Description URINE, RANDOM   Final   Special Requests NONE   Final   Culture  Setup Time  Final   Value: 12/08/2013 21:42     Performed at [facility] Count     Final   Value: NO GROWTH     Performed at [facility]   Culture     Final   Value: NO GROWTH     Performed at [facility]   Report Status 12/09/2013 FINAL   Final  MRSA PCR SCREENING     Status: None   Collection Time    12/08/13 11:18 PM      Result Value Range Status   MRSA by PCR NEGATIVE  NEGATIVE Final   Comment:            The GeneXpert MRSA  Assay (FDA     approved for NASAL specimens     only), is one component of a     comprehensive MRSA colonization     surveillance program. It is not     intended to diagnose MRSA     infection nor to guide or     monitor treatment for     MRSA infections.    Time coordinating discharge: 35 minutes.  Signed:  Hosie Sharman  Pager [phone] [facility] 12/10/2013, 1:47 PM

## 2013-12-10 NOTE — ED Notes (Addendum)
Pt from [facility]. Pt MD wanted him to come to ER because staff reported episode of sob. o2 stats 90% RA. Pt has hx of frequent pneumonia

## 2013-12-11 ENCOUNTER — Emergency Department (HOSPITAL_COMMUNITY): Payer: Medicare Other

## 2013-12-11 ENCOUNTER — Encounter (HOSPITAL_COMMUNITY): Payer: Self-pay | Admitting: Emergency Medicine

## 2013-12-11 LAB — CBC WITH DIFFERENTIAL/PLATELET
Basophils Absolute: 0 10*3/uL (ref 0.0–0.1)
Basophils Relative: 0 % (ref 0–1)
HCT: 35.7 % — ABNORMAL LOW (ref 39.0–52.0)
MCHC: 33.6 g/dL (ref 30.0–36.0)
Monocytes Absolute: 0.9 10*3/uL (ref 0.1–1.0)
Neutro Abs: 6.3 10*3/uL (ref 1.7–7.7)
Neutrophils Relative %: 82 % — ABNORMAL HIGH (ref 43–77)
Platelets: 188 10*3/uL (ref 150–400)
RDW: 14.7 % (ref 11.5–15.5)
WBC: 7.6 10*3/uL (ref 4.0–10.5)

## 2013-12-11 LAB — POCT I-STAT, CHEM 8
BUN: 24 mg/dL — ABNORMAL HIGH (ref 6–23)
Calcium, Ion: 1.21 mmol/L (ref 1.13–1.30)
HCT: 36 % — ABNORMAL LOW (ref 39.0–52.0)
Hemoglobin: 12.2 g/dL — ABNORMAL LOW (ref 13.0–17.0)
TCO2: 21 mmol/L (ref 0–100)

## 2013-12-11 LAB — CG4 I-STAT (LACTIC ACID): Lactic Acid, Venous: 0.83 mmol/L (ref 0.5–2.2)

## 2013-12-11 NOTE — ED Provider Notes (Signed)
CSN: [id number]     Arrival date & time 12/10/13  2356 History   First MD Initiated Contact with Patient 12/11/13 0007     Chief Complaint  Patient presents with  . Respiratory Distress   (Consider location/radiation/quality/duration/timing/severity/associated sxs/prior Treatment) HPI 79 year old demented nursing home patient discharged from hospital today for HCAP sent back tonight by the nursing home to the emergency department for apparent episode of shortness of breath prior to arrival with pulse oximetry borderline low at 90% on room air; patient is asymptomatic denies shortness of breath; taking Levaquin    Past Medical History  Diagnosis Date  . Coronary artery disease   . Hypertension   . Angina at rest   . Hyperlipidemia   . CVA (cerebral infarction)     "at least one since 2011", denies residual (05/29/2013)  . BPH (benign prostatic hyperplasia)   . Idiopathic polyneuropathy   . Peripheral neuropathy   . Pneumonia     Nathan Willis 02/25/2001 (05/29/2013)  . Bursitis   . Skin cancer     "off face & arms" (05/29/2013)  . Dementia    Past Surgical History  Procedure Laterality Date  . Appendectomy  1987    Nathan Willis 02/25/2001 (05/29/2013)  . Inguinal hernia repair Right   . Coronary artery bypass graft  1997    Nathan Willis 02/25/2001 (05/29/2013)  . Transurethral resection of prostate  1993    Nathan Willis 02/25/2001 (05/29/2013)  . Coronary angioplasty    . Reconstructive repair sternal  02/2001    sternal rewiring/notes 02/25/2001 (05/29/2013)   No family history on file. History  Substance Use Topics  . Smoking status: Former Smoker -- 2.00 packs/day for 30 years    Types: Cigarettes  . Smokeless tobacco: Never Used     Comment: 05/29/2013 "been quit smoking for 30 yrs"  . Alcohol Use: No     Comment: 05/29/2013 "quit alcohol 30 yr ago"    Review of Systems  Unable to perform ROS: Dementia    Allergies  Baycol; Codeine; Lescol; Lipitor; Morphine and related; Zetia; and Zocor  Home  Medications   Current Outpatient Rx  Name  Route  Sig  Dispense  Refill  . acetaminophen (TYLENOL) 325 MG tablet   Oral   Take 2 tablets (650 mg total) by mouth every 6 (six) hours as needed.         Marland Kitchen albuterol (PROVENTIL) (2.5 MG/3ML) 0.083% nebulizer solution   Nebulization   Take 2.5 mg by nebulization every 8 (eight) hours as needed for wheezing or shortness of breath.         Marland Kitchen aspirin EC 325 MG EC tablet   Oral   Take 1 tablet (325 mg total) by mouth daily.   30 tablet   0   . finasteride (PROSCAR) 5 MG tablet   Oral   Take 5 mg by mouth daily.         . furosemide (LASIX) 40 MG tablet   Oral   Take 40 mg by mouth daily.         . isosorbide mononitrate (IMDUR) 120 MG 24 hr tablet   Oral   Take 120 mg by mouth daily.         Marland Kitchen loperamide (IMODIUM) 2 MG capsule   Oral   Take 2 mg by mouth as needed for diarrhea or loose stools.         Marland Kitchen LORazepam (ATIVAN) 0.5 MG tablet   Oral   Take 0.5 mg by  mouth 2 (two) times daily as needed for anxiety.         . metoprolol succinate (TOPROL-XL) 25 MG 24 hr tablet   Oral   Take 25 mg by mouth daily.         . Multiple Vitamins-Minerals (DECUBI-VITE) CAPS   Oral   Take 1 capsule by mouth daily.          . pravastatin (PRAVACHOL) 20 MG tablet   Oral   Take 20 mg by mouth daily.         Marland Kitchen trimethoprim-polymyxin b (POLYTRIM) ophthalmic solution   Both Eyes   Place 2 drops into both eyes every 3 (three) hours while awake.         . levofloxacin (LEVAQUIN) 750 MG tablet   Oral   Take 1 tablet (750 mg total) by mouth every other day.   4 tablet   0    BP 136/61  Pulse 95  Temp(Src) 97.2 F (36.2 C) (Oral)  Resp 25  SpO2 96% Physical Exam  Nursing note and vitals reviewed. Constitutional:  Awake, alert, nontoxic appearance.  HENT:  Head: Atraumatic.  Eyes: Right eye exhibits no discharge. Left eye exhibits no discharge.  Neck: Neck supple.  Cardiovascular: Normal rate and regular  rhythm.   No murmur heard. Pulmonary/Chest: Effort normal. No respiratory distress. He has no wheezes. He has rales. He exhibits no tenderness.  few scattered crackles no wheezing no distress pulse oximetry mild hypoxemia 90-92% on room air   Abdominal: Soft. There is no tenderness. There is no rebound.  Musculoskeletal: He exhibits no tenderness.  Baseline ROM, no obvious new focal weakness.  Neurological: He is alert.  Mental status and motor strength appears baseline for patient and situation.  Skin: No rash noted.  Psychiatric: He has a normal mood and affect.    ED Course  Procedures (including critical care time) RA sats remain 91-94% Pt stable in ED with no significant deterioration in condition. Labs Review Labs Reviewed  CBC WITH DIFFERENTIAL - Abnormal; Notable for the following:    RBC 3.86 (*)    Hemoglobin 12.0 (*)    HCT 35.7 (*)    Neutrophils Relative % 82 (*)    Lymphocytes Relative 5 (*)    Lymphs Abs 0.4 (*)    All other components within normal limits  POCT I-STAT, CHEM 8 - Abnormal; Notable for the following:    Potassium 3.3 (*)    BUN 24 (*)    Glucose, Bld 100 (*)    Hemoglobin 12.2 (*)    HCT 36.0 (*)    All other components within normal limits  CG4 I-STAT (LACTIC ACID)   Imaging Review Dg Chest 1 View  12/11/2013   CLINICAL DATA:  Recent pneumonia.  EXAM: CHEST - 1 VIEW  COMPARISON:  12/08/2013  FINDINGS: Small left pleural effusion is smaller in volume. There is an associated opacity, presumably the patient's pneumonia. Cardiomegaly status post CABG. Chronic interstitial coarsening and hyperinflation.  IMPRESSION: 1. Decreasing left pleural effusion and lung opacification. 2. Interstitial coarsening which could be bronchitic or congestive.   Electronically Signed   By: Tiburcio Pea M.D.   On: 12/11/2013 00:55    EKG Interpretation   None       MDM   1. HCAP (healthcare-associated pneumonia)    I doubt any other EMC precluding  discharge at this time including, but not necessarily limited to the following:sepsis.    [name], MD 12/11/13  2239 

## 2013-12-15 LAB — CULTURE, BLOOD (ROUTINE X 2)

## 2013-12-15 NOTE — Clinical Social Work Psychosocial (Addendum)
Clinical Social Work Department BRIEF PSYCHOSOCIAL ASSESSMENT 12/15/2013  Patient:  Nathan Willis, Nathan Willis     Account Number:  [id number]     Admit date:  12/10/2013  Clinical Social Worker:  Tiburcio Pea  Date/Time:  12/11/2013 10:23 AM  Referred by:  Physician  Date Referred:  12/10/2013 Referred for  Other - See comment   Other Referral:   Return to ALF   Interview type:  Other - See comment Other interview type:   Paient and son- Bill    PSYCHOSOCIAL DATA Living Status:  FACILITY Admitted from facility:  [facility] Level of care:  Assisted Living Primary support name:  Abdirahman Chittum  [phone] Primary support relationship to patient:  CHILD, ADULT Degree of support available:   Strong support    CURRENT CONCERNS Current Concerns  Other - See comment   Other Concerns:   Return to ALF    SOCIAL WORK ASSESSMENT / PLAN 79 year old male - resident of [facility] where he lives in the demential unit with his wife.  Per MD - patient is medically stable for d/c today back to facilitly. CSW spoke to [facility]- Admissions at [facility]- She reviewed updated FL2 and oK given for return to facility.  Patient's son is unable to transport patient as he is out of town.  Facility is unable to transport.  CSW arranged EMS transport.   Assessment/plan status:  No Further Intervention Required Other assessment/ plan:   Information/referral to community resources:   None    PATIENT'S/FAMILY'S RESPONSE TO PLAN OF CARE: Patient is alert but confused- deferred to his son for d/c plans.  He wanted to return to ALF to be with his wife. Patient's son agreed with this and also wanted his dad to return to ALF.  Son was pleased with d/c plan and arrangements made. No further CSW needs identified. CSW signing off.  Lorri Frederick. Eural Holzschuh, LCSWA [phone]

## 2013-12-21 NOTE — Clinical Social Work Psychosocial (Signed)
     Clinical Social Work Department BRIEF PSYCHOSOCIAL ASSESSMENT 12/15/2013  Patient:  Nathan Willis, Nathan Willis     Account Number:  [id number]     Admit date:  12/10/2013  Clinical Social Worker:  Tiburcio Pea  Date/Time:  12/11/2013 10:23 AM  Referred by:  Physician  Date Referred:  12/10/2013 Referred for  Other - See comment   Other Referral:   Return to ALF   Interview type:  Other - See comment Other interview type:   Paient and son- Nathan Willis    PSYCHOSOCIAL DATA Living Status:  FACILITY Admitted from facility:  [facility] Level of care:  Assisted Living Primary support name:  Nathan Willis  [phone] Primary support relationship to patient:  CHILD, ADULT Degree of support available:   Strong support    CURRENT CONCERNS Current Concerns  Other - See comment   Other Concerns:   Return to ALF    SOCIAL WORK ASSESSMENT / PLAN 79 year old male - resident of [facility] where he lives in the demential unit with his wife.  Per MD - patient is medically stable for d/c today back to facilitly. CSW spoke to [facility]- Admissions at [facility]- She reviewed updated FL2 and oK given for return to facility.  Patient's son is unable to transport patient as he is out of town.  Facility is unable to transport.  CSW arranged EMS transport.   Assessment/plan status:  No Further Intervention Required Other assessment/ plan:   Information/referral to community resources:   None    PATIENTS/FAMILYS RESPONSE TO PLAN OF CARE: Patient is alert but confused- deferred to his son for d/c plans.  He wanted to return to ALF to be with his wife. Patient's son agreed with this and also wanted his dad to return to ALF.  Son was pleased with d/c plan and arrangements made. No further CSW needs identified. CSW signing off.  Nathan Willis. Nathan Willis, LCSWA [phone]

## 2013-12-25 ENCOUNTER — Ambulatory Visit: Payer: Medicare Other | Admitting: Internal Medicine

## 2014-02-11 ENCOUNTER — Ambulatory Visit: Payer: Medicare Other | Admitting: Podiatry

## 2014-04-07 ENCOUNTER — Emergency Department (HOSPITAL_COMMUNITY): Payer: Medicare Other

## 2014-04-07 ENCOUNTER — Encounter (HOSPITAL_COMMUNITY): Payer: Self-pay | Admitting: Emergency Medicine

## 2014-04-07 ENCOUNTER — Inpatient Hospital Stay (HOSPITAL_COMMUNITY)
Admission: EM | Admit: 2014-04-07 | Discharge: 2014-04-07 | DRG: 913 | Disposition: A | Discharge status: Nursing Home (Includes ICF) | Payer: Medicare Other | Attending: Internal Medicine | Admitting: Internal Medicine

## 2014-04-07 DIAGNOSIS — S0100XA Unspecified open wound of scalp, initial encounter: Secondary | ICD-10-CM | POA: Diagnosis present

## 2014-04-07 DIAGNOSIS — R296 Repeated falls: Secondary | ICD-10-CM | POA: Diagnosis present

## 2014-04-07 DIAGNOSIS — S61409A Unspecified open wound of unspecified hand, initial encounter: Secondary | ICD-10-CM | POA: Diagnosis present

## 2014-04-07 DIAGNOSIS — N189 Chronic kidney disease, unspecified: Secondary | ICD-10-CM

## 2014-04-07 DIAGNOSIS — T148XXA Other injury of unspecified body region, initial encounter: Secondary | ICD-10-CM

## 2014-04-07 DIAGNOSIS — Z951 Presence of aortocoronary bypass graft: Secondary | ICD-10-CM | POA: Diagnosis not present

## 2014-04-07 DIAGNOSIS — R222 Localized swelling, mass and lump, trunk: Secondary | ICD-10-CM | POA: Diagnosis present

## 2014-04-07 DIAGNOSIS — I2510 Atherosclerotic heart disease of native coronary artery without angina pectoris: Secondary | ICD-10-CM | POA: Diagnosis present

## 2014-04-07 DIAGNOSIS — Z79899 Other long term (current) drug therapy: Secondary | ICD-10-CM | POA: Diagnosis not present

## 2014-04-07 DIAGNOSIS — E876 Hypokalemia: Secondary | ICD-10-CM

## 2014-04-07 DIAGNOSIS — I129 Hypertensive chronic kidney disease with stage 1 through stage 4 chronic kidney disease, or unspecified chronic kidney disease: Secondary | ICD-10-CM | POA: Diagnosis present

## 2014-04-07 DIAGNOSIS — Z7982 Long term (current) use of aspirin: Secondary | ICD-10-CM | POA: Diagnosis not present

## 2014-04-07 DIAGNOSIS — J189 Pneumonia, unspecified organism: Secondary | ICD-10-CM | POA: Diagnosis present

## 2014-04-07 DIAGNOSIS — Z8673 Personal history of transient ischemic attack (TIA), and cerebral infarction without residual deficits: Secondary | ICD-10-CM

## 2014-04-07 DIAGNOSIS — Z888 Allergy status to other drugs, medicaments and biological substances status: Secondary | ICD-10-CM | POA: Diagnosis not present

## 2014-04-07 DIAGNOSIS — F0390 Unspecified dementia without behavioral disturbance: Secondary | ICD-10-CM | POA: Diagnosis present

## 2014-04-07 DIAGNOSIS — N400 Enlarged prostate without lower urinary tract symptoms: Secondary | ICD-10-CM | POA: Diagnosis present

## 2014-04-07 DIAGNOSIS — E785 Hyperlipidemia, unspecified: Secondary | ICD-10-CM | POA: Diagnosis present

## 2014-04-07 DIAGNOSIS — Y9210 Unspecified residential institution as the place of occurrence of the external cause: Secondary | ICD-10-CM | POA: Diagnosis present

## 2014-04-07 DIAGNOSIS — N179 Acute kidney failure, unspecified: Secondary | ICD-10-CM

## 2014-04-07 DIAGNOSIS — Z87891 Personal history of nicotine dependence: Secondary | ICD-10-CM | POA: Diagnosis not present

## 2014-04-07 DIAGNOSIS — Z885 Allergy status to narcotic agent status: Secondary | ICD-10-CM | POA: Diagnosis not present

## 2014-04-07 DIAGNOSIS — S0990XA Unspecified injury of head, initial encounter: Principal | ICD-10-CM

## 2014-04-07 DIAGNOSIS — E860 Dehydration: Secondary | ICD-10-CM | POA: Diagnosis present

## 2014-04-07 DIAGNOSIS — W19XXXA Unspecified fall, initial encounter: Secondary | ICD-10-CM

## 2014-04-07 LAB — BASIC METABOLIC PANEL
BUN: 27 mg/dL — ABNORMAL HIGH (ref 6–23)
CALCIUM: 8.8 mg/dL (ref 8.4–10.5)
CHLORIDE: 106 meq/L (ref 96–112)
CO2: 26 mEq/L (ref 19–32)
Creatinine, Ser: 1.44 mg/dL — ABNORMAL HIGH (ref 0.50–1.35)
GFR calc Af Amer: 48 mL/min — ABNORMAL LOW (ref >90)
GFR calc non Af Amer: 41 mL/min — ABNORMAL LOW (ref >90)
GLUCOSE: 138 mg/dL — AB (ref 70–99)
Potassium: 4 mEq/L (ref 3.7–5.3)
Sodium: 142 mEq/L (ref 137–147)

## 2014-04-07 LAB — CBC
HCT: 37.3 % — ABNORMAL LOW (ref 39.0–52.0)
HEMOGLOBIN: 12.5 g/dL — AB (ref 13.0–17.0)
MCH: 30.9 pg (ref 26.0–34.0)
MCHC: 33.5 g/dL (ref 30.0–36.0)
MCV: 92.3 fL (ref 78.0–100.0)
Platelets: 248 10*3/uL (ref 150–400)
RBC: 4.04 MIL/uL — ABNORMAL LOW (ref 4.22–5.81)
RDW: 14.1 % (ref 11.5–15.5)
WBC: 8.6 10*3/uL (ref 4.0–10.5)

## 2014-04-07 MED ORDER — LEVOFLOXACIN 500 MG PO TABS
750.0000 mg | ORAL_TABLET | Freq: Once | ORAL | Status: AC
  Administered 2014-04-07: 750 mg via ORAL
  Filled 2014-04-07: qty 2

## 2014-04-07 MED ORDER — VANCOMYCIN HCL IN DEXTROSE 1-5 GM/200ML-% IV SOLN: 1000.0000 mg | Freq: Once | INTRAVENOUS | Status: DC

## 2014-04-07 MED ORDER — CEFEPIME HCL 2 G IJ SOLR: 2.0000 g | INTRAMUSCULAR | Status: DC

## 2014-04-07 MED ORDER — VANCOMYCIN HCL IN DEXTROSE 1-5 GM/200ML-% IV SOLN: 1000.0000 mg | INTRAVENOUS | Status: DC

## 2014-04-07 MED ORDER — SODIUM CHLORIDE 0.9 % IV BOLUS (SEPSIS)
500.0000 mL | Freq: Once | INTRAVENOUS | Status: AC
  Administered 2014-04-07: 500 mL via INTRAVENOUS

## 2014-04-07 MED ORDER — PIPERACILLIN-TAZOBACTAM 3.375 G IVPB 30 MIN
3.3750 g | Freq: Once | INTRAVENOUS | Status: AC
  Administered 2014-04-07: 3.375 g via INTRAVENOUS
  Filled 2014-04-07: qty 50

## 2014-04-07 MED ORDER — LEVOFLOXACIN 750 MG PO TABS: 750.0000 mg | ORAL_TABLET | Freq: Every day | ORAL | Status: DC

## 2014-04-07 NOTE — ED Provider Notes (Addendum)
CSN: [id number]     Arrival date & time 04/07/14  6378 History   First MD Initiated Contact with Patient 04/07/14 0654     Chief Complaint  Patient presents with  . Fall     (Consider location/radiation/quality/duration/timing/severity/associated sxs/prior Treatment) HPI Comments: 80 yo male from [facility] presents from EMS after a witnessed fall PTA.  Pt fell after losing his balance while coming out of the bathroom.  Skin tears bilateral arms.  Pt has fallen multiple times in the past.  Scalp skin tear.  No recent fevers however pt has had recent cough.  Pt feels at baseline and recalls event. Cardiac and stroke hx.  Pneumonia hx.  No loc.    Patient is a 80 y.o. male presenting with fall. The history is provided by the patient and the nursing home.  Fall Associated symptoms include headaches. Pertinent negatives include no chest pain, no abdominal pain and no shortness of breath.    Past Medical History  Diagnosis Date  . Coronary artery disease   . Hypertension   . Angina at rest   . Hyperlipidemia   . CVA (cerebral infarction)     "at least one since 2011", denies residual (05/29/2013)  . BPH (benign prostatic hyperplasia)   . Idiopathic polyneuropathy   . Peripheral neuropathy   . Pneumonia     [name] 02/25/2001 (05/29/2013)  . Bursitis   . Skin cancer     "off face & arms" (05/29/2013)  . Dementia    Past Surgical History  Procedure Laterality Date  . Appendectomy  1987    [name] 02/25/2001 (05/29/2013)  . Inguinal hernia repair Right   . Coronary artery bypass graft  1997    [name] 02/25/2001 (05/29/2013)  . Transurethral resection of prostate  1993    [name] 02/25/2001 (05/29/2013)  . Coronary angioplasty    . Reconstructive repair sternal  02/2001    sternal rewiring/notes 02/25/2001 (05/29/2013)   History reviewed. No pertinent family history. History  Substance Use Topics  . Smoking status: Former Smoker -- 2.00 packs/day for 30 years    Types: Cigarettes  . Smokeless  tobacco: Never Used     Comment: 05/29/2013 "been quit smoking for 30 yrs"  . Alcohol Use: No     Comment: 05/29/2013 "quit alcohol 30 yr ago"    Review of Systems  Constitutional: Negative for fever and chills.  HENT: Negative for congestion.   Eyes: Negative for visual disturbance.  Respiratory: Negative for shortness of breath.   Cardiovascular: Negative for chest pain.  Gastrointestinal: Negative for vomiting and abdominal pain.  Genitourinary: Negative for dysuria and flank pain.  Musculoskeletal: Positive for arthralgias. Negative for back pain, neck pain and neck stiffness.  Skin: Negative for rash.  Neurological: Positive for headaches. Negative for light-headedness.      Allergies  Baycol; Codeine; Lescol; Lipitor; Morphine and related; Zetia; and Zocor  Home Medications   Prior to Admission medications   Medication Sig Start Date End Date Taking? Authorizing Provider  acetaminophen (TYLENOL) 325 MG tablet Take 2 tablets (650 mg total) by mouth every 6 (six) hours as needed. 06/02/13  Yes [name] Leonie Green, MD  albuterol (PROVENTIL) (2.5 MG/3ML) 0.083% nebulizer solution Take 2.5 mg by nebulization every 8 (eight) hours as needed for wheezing or shortness of breath.   Yes Historical Provider, MD  aspirin EC 325 MG EC tablet Take 1 tablet (325 mg total) by mouth daily. 06/02/13  Yes [name] Leonie Green, MD  finasteride (PROSCAR) 5 MG tablet Take 5 mg by mouth daily.   Yes Historical Provider, MD  isosorbide mononitrate (IMDUR) 120 MG 24 hr tablet Take 120 mg by mouth daily.   Yes Historical Provider, MD  LORazepam (ATIVAN) 0.5 MG tablet Take 0.5 mg by mouth 2 (two) times daily as needed for anxiety.   Yes Historical Provider, MD  metoprolol succinate (TOPROL-XL) 25 MG 24 hr tablet Take 25 mg by mouth daily.   Yes Historical Provider, MD  Multiple Vitamins-Minerals (DECUBI-VITE) CAPS Take 1 capsule by mouth daily.    Yes Historical Provider, MD  pravastatin  (PRAVACHOL) 20 MG tablet Take 20 mg by mouth daily.   Yes Historical Provider, MD  loperamide (IMODIUM) 2 MG capsule Take 2 mg by mouth as needed for diarrhea or loose stools.    Historical Provider, MD   BP 121/59  Pulse 77  Temp(Src) 98.5 F (36.9 C) (Oral)  Resp 20  SpO2 94% Physical Exam  Nursing note and vitals reviewed. Constitutional: He is oriented to person, place, and time. He appears well-developed and well-nourished.  HENT:  Head: Normocephalic.  2 cm stellate lac top of head, no gaping  Eyes: Conjunctivae are normal. Right eye exhibits no discharge. Left eye exhibits no discharge.  Neck: Normal range of motion. Neck supple. No tracheal deviation present.  Cardiovascular: Normal rate and regular rhythm.   Pulmonary/Chest: Effort normal. He has rales (few bilateral base).  Abdominal: Soft. He exhibits no distension. There is no tenderness. There is no guarding.  Musculoskeletal: He exhibits tenderness. He exhibits no edema.  Mild tender distal radius right arm and carpal bones left arm No pain with rom of hips, knees, ankles Mild swelling ankles bilateral No midline vertebral tenderness  Full rom head and neck, supple  Neurological: He is alert and oriented to person, place, and time. No cranial nerve deficit.  Skin: Skin is warm. No rash noted.  Superficial skin tears bilateral forearms, no step off  Psychiatric: He has a normal mood and affect.    ED Course  Procedures (including critical care time) Labs Review Labs Reviewed  CBC - Abnormal; Notable for the following:    RBC 4.04 (*)    Hemoglobin 12.5 (*)    HCT 37.3 (*)    All other components within normal limits  BASIC METABOLIC PANEL - Abnormal; Notable for the following:    Glucose, Bld 138 (*)    BUN 27 (*)    Creatinine, Ser 1.44 (*)    GFR calc non Af Amer 41 (*)    GFR calc Af Amer 48 (*)    All other components within normal limits  CULTURE, BLOOD (ROUTINE X 2)  CULTURE, BLOOD (ROUTINE X 2)     Imaging Review Dg Chest 2 View  04/07/2014   CLINICAL DATA:  Pain post trauma  EXAM: CHEST  2 VIEW  COMPARISON:  December 11, 2013  FINDINGS: There is underlying emphysema. There is airspace consolidation on the left with left effusion. Right lung is clear. Heart is enlarged with normal pulmonary vascularity. Patient is status post coronary artery bypass grafting. There is a bone island in the anterior right first rib. There is arthropathy in the shoulders bilaterally.  IMPRESSION: Left lower lobe consolidation with left effusion, superimposed on emphysema. Right lung is clear. There is a bone island in the right first rib anteriorly, stable. No pneumothorax.   Electronically Signed   By: Lowella Grip M.D.   On: 04/07/2014 07:53  Dg Wrist Complete Left  04/07/2014   CLINICAL DATA:  Pain post trauma  EXAM: LEFT WRIST - COMPLETE 3+ VIEW  COMPARISON:  None.  FINDINGS: Frontal, oblique, lateral, and ulnar deviation scaphoid images were obtained. There is no demonstrable fracture or dislocation. Joint spaces appear intact. No erosive change.  IMPRESSION: No appreciable fracture or dislocation.   Electronically Signed   By: Lowella Grip M.D.   On: 04/07/2014 07:57   Dg Wrist Complete Right  04/07/2014   CLINICAL DATA:  Fall.  Skin tear on the posterior wrist.  EXAM: RIGHT WRIST - COMPLETE 3+ VIEW  COMPARISON:  Left wrist 04/07/2014  FINDINGS: Bone spurring and degenerative changes at the radiocarpal joint. Negative for an acute fracture or dislocation. Mild degenerative changes at the first carpometacarpal joint. Carpal bones appear to be intact.  IMPRESSION: No acute bone abnormality to the right wrist.  Mild degenerative changes in the wrist.   Electronically Signed   By: Markus Daft M.D.   On: 04/07/2014 08:01   Ct Head Wo Contrast  04/07/2014   CLINICAL DATA:  Pain post trauma  EXAM: CT HEAD WITHOUT CONTRAST  CT CERVICAL SPINE WITHOUT CONTRAST  TECHNIQUE: Multidetector CT imaging of the head  and cervical spine was performed following the standard protocol without intravenous contrast. Multiplanar CT image reconstructions of the cervical spine were also generated.  COMPARISON:  May 29, 2013  FINDINGS: CT HEAD FINDINGS  Moderate diffuse atrophy is stable. There is no mass, hemorrhage, extra-axial fluid collection, or midline shift. There is a prior left frontal lobe infarct, stable. There is patchy small vessel disease in the centra semiovale bilaterally, stable. There is no demonstrable acute infarct. Bony calvarium appears intact. The mastoid air cells are clear.  CT CERVICAL SPINE FINDINGS  There is no fracture. Slight anterolisthesis of C3 on C4 is felt to be due to underlying spondylosis. There is no other spondylolisthesis. Prevertebral soft tissues and predental space regions are normal. There is fairly marked disc space narrowing at all levels except for C2-3. There is multilevel facet hypertrophy with exit foraminal narrowing at C3-4 on the right and C4-5, marked due does severe bony hypertrophy. Similar changes are fairly severe at C5-6 and C6-7 bilaterally due to bony hypertrophy. There is no erosive change. There is calcification in the nuchal ligament posteriorly in the lower cervical region.  There is a mass in the right apex anteriorly measuring 1.7 x 1.0 cm. There is a moderate left-sided pleural effusion. There is bilateral apical scarring as well.  IMPRESSION: CT head: Atrophy with small vessel disease in the periventricular regions. Prior left frontal lobe infarct. No acute appearing infarct. No hemorrhage, mass, or extra-axial fluid.  CT cervical spine: Extensive spondylosis and osteoarthritic change. No fracture or spondylolisthesis.  Mass in right apex measuring 1.7 x 1.0 cm. This finding is concerning for a focal neoplasm. Complete chest CT advised to further assess. There is a left pleural effusion.  Critical Value/emergent results were called by telephone at the time of  interpretation on 04/07/2014 at 8:08 AM to Dr. Elnora Morrison , who verbally acknowledged these results.   Electronically Signed   By: Lowella Grip M.D.   On: 04/07/2014 08:10   Ct Cervical Spine Wo Contrast  04/07/2014   CLINICAL DATA:  Pain post trauma  EXAM: CT HEAD WITHOUT CONTRAST  CT CERVICAL SPINE WITHOUT CONTRAST  TECHNIQUE: Multidetector CT imaging of the head and cervical spine was performed following the standard protocol without intravenous contrast.  Multiplanar CT image reconstructions of the cervical spine were also generated.  COMPARISON:  May 29, 2013  FINDINGS: CT HEAD FINDINGS  Moderate diffuse atrophy is stable. There is no mass, hemorrhage, extra-axial fluid collection, or midline shift. There is a prior left frontal lobe infarct, stable. There is patchy small vessel disease in the centra semiovale bilaterally, stable. There is no demonstrable acute infarct. Bony calvarium appears intact. The mastoid air cells are clear.  CT CERVICAL SPINE FINDINGS  There is no fracture. Slight anterolisthesis of C3 on C4 is felt to be due to underlying spondylosis. There is no other spondylolisthesis. Prevertebral soft tissues and predental space regions are normal. There is fairly marked disc space narrowing at all levels except for C2-3. There is multilevel facet hypertrophy with exit foraminal narrowing at C3-4 on the right and C4-5, marked due does severe bony hypertrophy. Similar changes are fairly severe at C5-6 and C6-7 bilaterally due to bony hypertrophy. There is no erosive change. There is calcification in the nuchal ligament posteriorly in the lower cervical region.  There is a mass in the right apex anteriorly measuring 1.7 x 1.0 cm. There is a moderate left-sided pleural effusion. There is bilateral apical scarring as well.  IMPRESSION: CT head: Atrophy with small vessel disease in the periventricular regions. Prior left frontal lobe infarct. No acute appearing infarct. No hemorrhage, mass, or  extra-axial fluid.  CT cervical spine: Extensive spondylosis and osteoarthritic change. No fracture or spondylolisthesis.  Mass in right apex measuring 1.7 x 1.0 cm. This finding is concerning for a focal neoplasm. Complete chest CT advised to further assess. There is a left pleural effusion.  Critical Value/emergent results were called by telephone at the time of interpretation on 04/07/2014 at 8:08 AM to Dr. Elnora Morrison , who verbally acknowledged these results.   Electronically Signed   By: Lowella Grip M.D.   On: 04/07/2014 08:10     EKG Interpretation None      MDM   Final diagnoses:  Hypokalemia  ARF (acute renal failure)  BPH (benign prostatic hyperplasia)  Skin tears Fall Acute head injury HCAP  Likely mechanical fall with age/ using walker at baseline.  Plan for CTs, xrays, basic labs.  Mild cough, cxr to look for pneumonia, O2 okay.  CXR showed pneumonia, cultures and HCAP abx ordered, pt not requiring O2. Radiologist called, no fx or CNS bleed however likely lung CA, rec CT chest. Discussed results with pt who does not want treatment or further work up for CA, he thinks he has been told he has lung CA in the past.   Pt asking to go back to NH after abx, difficult to assess decision capacity with dementia.  Spoke with his prior pcp and his son, his son Billl phone [phone] is POA and says he does not have decision making capacity for complex decisions especially health care, he is okay with him coming in for 2 days of IV abx. Will hold on CT chest at this time with age and likely will not change mgmt.  HCAP, CRF, Dehydration, Lung mass   Mariea Clonts, MD 04/07/14 2952  Mariea Clonts, MD 04/07/14 [phone]

## 2014-04-07 NOTE — Discharge Instructions (Signed)
See your NH doctor for recheck in 2 days.  If you were given medicines take as directed.  If you are on coumadin or contraceptives realize their levels and effectiveness is altered by many different medicines.  If you have any reaction (rash, tongues swelling, other) to the medicines stop taking and see a physician.   Please follow up as directed and return to the ER or see a physician for new or worsening symptoms.  Thank you. Filed Vitals:   04/07/14 0644  BP: 121/59  Pulse: 77  Temp: 98.5 F (36.9 C)  TempSrc: Oral  Resp: 20  SpO2: 94%

## 2014-04-07 NOTE — Progress Notes (Signed)
ANTIBIOTIC CONSULT NOTE - INITIAL  Pharmacy Consult for vancomycin/cefepime Indication: rule out pneumonia  Allergies  Allergen Reactions  . Baycol [Cerivastatin] Other (See Comments)    REACTION:  unknown  . Codeine Other (See Comments)    REACTION:  "makes me crazy"  . Lescol [Fluvastatin Sodium] Other (See Comments)    REACTION:  unknown  . Lipitor [Atorvastatin] Other (See Comments)    REACTION:  "makes me violent"  . Morphine And Related Other (See Comments)    REACTION:  "makes me crazy"  . Zetia [Ezetimibe] Other (See Comments)    REACTION:  unknown  . Zocor [Simvastatin] Other (See Comments)    REACTION:  unknown    Patient Measurements:   Adjusted Body Weight:   Vital Signs: Temp: 98.5 F (36.9 C) (04/15 0644) Temp src: Oral (04/15 0644) BP: 121/59 mmHg (04/15 0644) Pulse Rate: 77 (04/15 0644) Intake/Output from previous day:   Intake/Output from this shift:    Labs:  Recent Labs  04/07/14 0810  WBC 8.6  HGB 12.5*  PLT 248  CREATININE 1.44*   The CrCl is unknown because both a height and weight (above a minimum accepted value) are required for this calculation. No results found for this basename: VANCOTROUGH, VANCOPEAK, VANCORANDOM, GENTTROUGH, GENTPEAK, GENTRANDOM, TOBRATROUGH, TOBRAPEAK, TOBRARND, AMIKACINPEAK, AMIKACINTROU, AMIKACIN,  in the last 72 hours   Microbiology: No results found for this or any previous visit (from the past 720 hour(s)).  Medical History: Past Medical History  Diagnosis Date  . Coronary artery disease   . Hypertension   . Angina at rest   . Hyperlipidemia   . CVA (cerebral infarction)     "at least one since 2011", denies residual (05/29/2013)  . BPH (benign prostatic hyperplasia)   . Idiopathic polyneuropathy   . Peripheral neuropathy   . Pneumonia     [name] 02/25/2001 (05/29/2013)  . Bursitis   . Skin cancer     "off face & arms" (05/29/2013)  . Dementia     Assessment: 59 YOM presents with fall from SNF, CXR  reveals LLL consolidation with L effusion. Orders to start broad spectrum antibiotics for HCAP, Vancomycin and zosyn ordered x 1 dose in ED with vancomycin and cefepime ordered to continue.    4/15 >> vancomycin  >> 4/15 >> cefepime  >>    Tmax: afeb WBCs: 8.6 Renal: Scr = 1.44 for est CrCl = 3ml/min (C-G, N)  4/15 blood: / urine:  / sputum: ordered / respiratory virus panel: ordered / influenza Ag: ordered  Goal of Therapy:  Vancomycin trough level 15-20 mcg/ml  Plan:   Vancomycin 1gm x 1 in ED then 1gm IV q24h  Monitor renal function and check level as appropriate  Cefepime 2gm IV q24h (start ~6h after zosyn dose)  Doreene Eland, PharmD, BCPS.   Pager: [phone]  04/07/2014,9:36 AM

## 2014-04-07 NOTE — ED Notes (Signed)
Patient arrives from [facility] via EMS due to c/o fall that occurred "a few minutes ago", per EMS Patient alert and oriented x 4 Patient fall after losing balance while coming out of bathroom Patient denies c/o pain Patient denies LOC Small skin tear noted to top of head CBG 148

## 2014-04-07 NOTE — ED Notes (Signed)
Bed: WA17 Expected date:  Expected time:  Means of arrival:  Comments: EMS 80yo M , fall

## 2014-04-07 NOTE — ED Notes (Signed)
Pt's primary RN notified this RN that Pt would like to leave and the MD is going to d/c. This RN followed up w/ Pt regarding admission/discharge.  Pt is adamant that he would like to go home.  This RN reinforced that the MD would like to admit him, so he can receive IV antibiotics.  Pt continues to want to leave.  Pt verbalizes that he is at a facility and they can provide him w/ oral antibiotics.  Primary RN notified.

## 2014-04-07 NOTE — ED Notes (Signed)
MD at bedside. 

## 2014-04-07 NOTE — ED Notes (Signed)
Patient states that he "tripped over backwards"--denies LOC Patient reports that he has had issues with falling "numerous times" in the past and typically uses a walker to ambulate with Skin tears noted to bilateral hands and back of head

## 2014-04-07 NOTE — ED Notes (Signed)
Patient noted to have congested cough upon assessment Patient states that he does not know how long cough has been present and denies productive coughing RR WNL--even and unlabored with equal rise and fall of chest Patient in NAD

## 2014-04-09 ENCOUNTER — Encounter (HOSPITAL_COMMUNITY): Payer: Self-pay | Admitting: Emergency Medicine

## 2014-04-09 ENCOUNTER — Emergency Department (HOSPITAL_COMMUNITY): Payer: Medicare Other

## 2014-04-09 ENCOUNTER — Emergency Department (HOSPITAL_COMMUNITY)
Admission: EM | Admit: 2014-04-09 | Discharge: 2014-04-09 | Disposition: A | Discharge status: Home / Self Care | Payer: Medicare Other | Attending: Emergency Medicine | Admitting: Emergency Medicine

## 2014-04-09 DIAGNOSIS — W19XXXA Unspecified fall, initial encounter: Secondary | ICD-10-CM

## 2014-04-09 DIAGNOSIS — I10 Essential (primary) hypertension: Secondary | ICD-10-CM | POA: Insufficient documentation

## 2014-04-09 DIAGNOSIS — Z8669 Personal history of other diseases of the nervous system and sense organs: Secondary | ICD-10-CM | POA: Insufficient documentation

## 2014-04-09 DIAGNOSIS — Z951 Presence of aortocoronary bypass graft: Secondary | ICD-10-CM | POA: Insufficient documentation

## 2014-04-09 DIAGNOSIS — Z9861 Coronary angioplasty status: Secondary | ICD-10-CM | POA: Insufficient documentation

## 2014-04-09 DIAGNOSIS — W1809XA Striking against other object with subsequent fall, initial encounter: Secondary | ICD-10-CM | POA: Insufficient documentation

## 2014-04-09 DIAGNOSIS — S8010XA Contusion of unspecified lower leg, initial encounter: Secondary | ICD-10-CM | POA: Insufficient documentation

## 2014-04-09 DIAGNOSIS — S40029A Contusion of unspecified upper arm, initial encounter: Secondary | ICD-10-CM | POA: Insufficient documentation

## 2014-04-09 DIAGNOSIS — F0390 Unspecified dementia without behavioral disturbance: Secondary | ICD-10-CM | POA: Insufficient documentation

## 2014-04-09 DIAGNOSIS — Z79899 Other long term (current) drug therapy: Secondary | ICD-10-CM | POA: Insufficient documentation

## 2014-04-09 DIAGNOSIS — Z85828 Personal history of other malignant neoplasm of skin: Secondary | ICD-10-CM | POA: Insufficient documentation

## 2014-04-09 DIAGNOSIS — I2510 Atherosclerotic heart disease of native coronary artery without angina pectoris: Secondary | ICD-10-CM | POA: Insufficient documentation

## 2014-04-09 DIAGNOSIS — Z8673 Personal history of transient ischemic attack (TIA), and cerebral infarction without residual deficits: Secondary | ICD-10-CM | POA: Insufficient documentation

## 2014-04-09 DIAGNOSIS — Z792 Long term (current) use of antibiotics: Secondary | ICD-10-CM | POA: Insufficient documentation

## 2014-04-09 DIAGNOSIS — S0093XA Contusion of unspecified part of head, initial encounter: Secondary | ICD-10-CM

## 2014-04-09 DIAGNOSIS — Z87448 Personal history of other diseases of urinary system: Secondary | ICD-10-CM | POA: Insufficient documentation

## 2014-04-09 DIAGNOSIS — Z8739 Personal history of other diseases of the musculoskeletal system and connective tissue: Secondary | ICD-10-CM | POA: Insufficient documentation

## 2014-04-09 DIAGNOSIS — S0083XA Contusion of other part of head, initial encounter: Principal | ICD-10-CM | POA: Insufficient documentation

## 2014-04-09 DIAGNOSIS — Y9389 Activity, other specified: Secondary | ICD-10-CM | POA: Insufficient documentation

## 2014-04-09 DIAGNOSIS — S0003XA Contusion of scalp, initial encounter: Secondary | ICD-10-CM | POA: Insufficient documentation

## 2014-04-09 DIAGNOSIS — Z87891 Personal history of nicotine dependence: Secondary | ICD-10-CM | POA: Insufficient documentation

## 2014-04-09 DIAGNOSIS — Z7982 Long term (current) use of aspirin: Secondary | ICD-10-CM | POA: Insufficient documentation

## 2014-04-09 DIAGNOSIS — Y929 Unspecified place or not applicable: Secondary | ICD-10-CM | POA: Insufficient documentation

## 2014-04-09 DIAGNOSIS — E785 Hyperlipidemia, unspecified: Secondary | ICD-10-CM | POA: Insufficient documentation

## 2014-04-09 DIAGNOSIS — Z8701 Personal history of pneumonia (recurrent): Secondary | ICD-10-CM | POA: Insufficient documentation

## 2014-04-09 DIAGNOSIS — S1093XA Contusion of unspecified part of neck, initial encounter: Principal | ICD-10-CM

## 2014-04-09 NOTE — ED Notes (Signed)
Per EMS, Pt fell from wheelchair to another chair and needs to be evaluated. A&Ox4.  An MD at nursing facility thought that his BP was low - 90/60. BP for EMS was 110/70s.

## 2014-04-09 NOTE — Discharge Instructions (Signed)
Contusion  A contusion is a deep bruise. Contusions happen when an injury causes bleeding under the skin. Signs of bruising include pain, puffiness (swelling), and discolored skin. The contusion may turn blue, purple, or yellow.  HOME CARE   · Put ice on the injured area.  · Put ice in a plastic bag.  · Place a towel between your skin and the bag.  · Leave the ice on for 15-20 minutes, 03-04 times a day.  · Only take medicine as told by your doctor.  · Rest the injured area.  · If possible, raise (elevate) the injured area to lessen puffiness.  GET HELP RIGHT AWAY IF:   · You have more bruising or puffiness.  · You have pain that is getting worse.  · Your puffiness or pain is not helped by medicine.  MAKE SURE YOU:   · Understand these instructions.  · Will watch your condition.  · Will get help right away if you are not doing well or get worse.  Document Released: 05/28/2008 Document Revised: 03/03/2012 Document Reviewed: 10/15/2011  [employer]® Patient Information ©2014 [employer].

## 2014-04-09 NOTE — ED Provider Notes (Addendum)
CSN: [id number]     Arrival date & time 04/09/14  1615 History   First MD Initiated Contact with Patient 04/09/14 1634     Chief Complaint  Patient presents with  . Fall     (Consider location/radiation/quality/duration/timing/severity/associated sxs/prior Treatment) Patient is a 80 y.o. male presenting with fall. The history is provided by the patient.  Fall This is a recurrent (was transferring from wheelchair to chair and lost balance and fell backwards and hit his head) problem. The current episode started less than 1 hour ago. The problem occurs constantly. The problem has been resolved. Pertinent negatives include no chest pain, no headaches and no shortness of breath. Nothing aggravates the symptoms. Nothing relieves the symptoms. He has tried nothing for the symptoms. The treatment provided no relief.    Past Medical History  Diagnosis Date  . Coronary artery disease   . Hypertension   . Angina at rest   . Hyperlipidemia   . CVA (cerebral infarction)     "at least one since 2011", denies residual (05/29/2013)  . BPH (benign prostatic hyperplasia)   . Idiopathic polyneuropathy   . Peripheral neuropathy   . Pneumonia     [name] 02/25/2001 (05/29/2013)  . Bursitis   . Skin cancer     "off face & arms" (05/29/2013)  . Dementia    Past Surgical History  Procedure Laterality Date  . Appendectomy  1987    [name] 02/25/2001 (05/29/2013)  . Inguinal hernia repair Right   . Coronary artery bypass graft  1997    [name] 02/25/2001 (05/29/2013)  . Transurethral resection of prostate  1993    [name] 02/25/2001 (05/29/2013)  . Coronary angioplasty    . Reconstructive repair sternal  02/2001    sternal rewiring/notes 02/25/2001 (05/29/2013)   No family history on file. History  Substance Use Topics  . Smoking status: Former Smoker -- 2.00 packs/day for 30 years    Types: Cigarettes  . Smokeless tobacco: Never Used     Comment: 05/29/2013 "been quit smoking for 30 yrs"  . Alcohol Use: No   Comment: 05/29/2013 "quit alcohol 30 yr ago"    Review of Systems  Respiratory: Negative for shortness of breath.   Cardiovascular: Negative for chest pain.  Neurological: Negative for headaches.  All other systems reviewed and are negative.     Allergies  Baycol; Codeine; Lescol; Lipitor; Morphine and related; Zetia; and Zocor  Home Medications   Prior to Admission medications   Medication Sig Start Date End Date Taking? Authorizing Provider  acetaminophen (TYLENOL) 325 MG tablet Take 2 tablets (650 mg total) by mouth every 6 (six) hours as needed. 06/02/13   Erline Hau, MD  albuterol (PROVENTIL) (2.5 MG/3ML) 0.083% nebulizer solution Take 2.5 mg by nebulization every 8 (eight) hours as needed for wheezing or shortness of breath.    Historical Provider, MD  aspirin EC 325 MG EC tablet Take 1 tablet (325 mg total) by mouth daily. 06/02/13   Erline Hau, MD  finasteride (PROSCAR) 5 MG tablet Take 5 mg by mouth daily.    Historical Provider, MD  isosorbide mononitrate (IMDUR) 120 MG 24 hr tablet Take 120 mg by mouth daily.    Historical Provider, MD  levofloxacin (LEVAQUIN) 750 MG tablet Take 1 tablet (750 mg total) by mouth daily. 04/08/14   Mariea Clonts, MD  loperamide (IMODIUM) 2 MG capsule Take 2 mg by mouth as needed for diarrhea or loose stools.    Historical  Provider, MD  LORazepam (ATIVAN) 0.5 MG tablet Take 0.5 mg by mouth 2 (two) times daily as needed for anxiety.    Historical Provider, MD  metoprolol succinate (TOPROL-XL) 25 MG 24 hr tablet Take 25 mg by mouth daily.    Historical Provider, MD  Multiple Vitamins-Minerals (DECUBI-VITE) CAPS Take 1 capsule by mouth daily.     Historical Provider, MD  pravastatin (PRAVACHOL) 20 MG tablet Take 20 mg by mouth daily.    Historical Provider, MD   BP 110/59  Pulse 77  Temp(Src) 98.6 F (37 C) (Oral)  Resp 16  SpO2 92% Physical Exam  Nursing note and vitals reviewed. Constitutional: He is oriented to  person, place, and time. He appears well-developed and well-nourished. No distress.  HENT:  Head: Normocephalic and atraumatic.    Mouth/Throat: Oropharynx is clear and moist.  Healing ecchymosis and hematoma over the occiput and to hematoma over the right parietal area  Eyes: Conjunctivae and EOM are normal. Pupils are equal, round, and reactive to light.  Neck: Normal range of motion. Neck supple. No spinous process tenderness and no muscular tenderness present.  Cardiovascular: Normal rate, regular rhythm and intact distal pulses.   No murmur heard. Pulmonary/Chest: Effort normal and breath sounds normal. No respiratory distress. He has no wheezes. He has no rales.  Abdominal: Soft. He exhibits no distension. There is no tenderness. There is no rebound and no guarding.  Musculoskeletal: Normal range of motion. He exhibits no edema and no tenderness.  Various ecchymoses over the upper and lower extremities in various stages of healing  Neurological: He is alert and oriented to person, place, and time.  Skin: Skin is warm and dry. No rash noted. No erythema.  Psychiatric: He has a normal mood and affect. His behavior is normal.    ED Course  Procedures (including critical care time) Labs Review Labs Reviewed - No data to display  Imaging Review Ct Head Wo Contrast  04/09/2014   CLINICAL DATA:  [place] from wheelchair.  EXAM: CT HEAD WITHOUT CONTRAST  TECHNIQUE: Contiguous axial images were obtained from the base of the skull through the vertex without intravenous contrast.  COMPARISON:  04/07/2014  FINDINGS: Ventricles are normal in configuration. There is ventricular and sulcal enlargement reflecting atrophy. No hydrocephalus.  No parenchymal masses or mass effect. No evidence of a recent infarct.  Old infarcts are noted in the left frontal and left parietal lobes. There is patchy white matter hypoattenuation consistent with mild chronic microvascular ischemic change.  No extra-axial masses  or abnormal fluid collections.  No intracranial hemorrhage. Ethmoid sinus mucosal thickening is noted. Remaining visualized sinuses are clear as are the mastoid air cells.  No skull fracture.  IMPRESSION: 1. No acute intracranial abnormalities. No change from the prior study. 2. Atrophy, old infarcts and chronic microvascular ischemic change.   Electronically Signed   By: Lajean Manes M.D.   On: 04/09/2014 17:11     EKG Interpretation None      MDM   Final diagnoses:  Fall  Traumatic hematoma of head    Patient with a mechanical fall today. He was being transferred from a wheelchair to a chair and while he was standing and he lost his balance and fell backwards hitting his head. He states that he has multiple falls in the past and today when he hit his head had no LOC. He takes no anticoagulation.  Patient has no complaints at this time he has no injury to the extremities  or torso. He denies C-spine tenderness. Head Ct pending.  5:27 PM Head CT neg.  Will d/c pt home.  Blanchie Dessert, MD 04/09/14 [place], MD 04/09/14 1729

## 2014-04-09 NOTE — ED Notes (Addendum)
Pt sts he has had multiple falls over the past couple of months. Pt sts that today he fell out of his wheelchair. Pt denies injury, and LOC. A&Ox4. NAD noted.

## 2014-04-09 NOTE — ED Notes (Signed)
[facility] called for estimated time of arrival. Told Pt is second on list once the [facility] list is complete.

## 2014-04-09 NOTE — ED Notes (Signed)
Pt found moving to edge of bed. Pt reminded not to get up without pushing the call bell and waiting for assistance. Pt verbalized understanding.

## 2014-04-09 NOTE — ED Notes (Signed)
[facility] called for transport back to [facility]. Report given to caregiver at the [facility].

## 2014-04-09 NOTE — ED Notes (Signed)
Bed: HE17 Expected date:  Expected time:  Means of arrival:  Comments: EMS- elderly, fell during transfer from wheelchair to chair, denies LOC

## 2014-04-09 NOTE — ED Notes (Signed)
Pt found out in hallway walking to bathroom. Pt. Two-person assisted to the bathroom then put in a wheelchair back to room. Pt reminded not to get up without assistance and given the call bell to use if he needed anything.

## 2014-04-13 LAB — CULTURE, BLOOD (ROUTINE X 2)
CULTURE: NO GROWTH
Culture: NO GROWTH

## 2014-06-01 ENCOUNTER — Ambulatory Visit: Payer: Medicare Other | Admitting: Podiatry

## 2014-08-21 ENCOUNTER — Encounter: Payer: Self-pay | Admitting: *Deleted

## 2014-12-12 ENCOUNTER — Emergency Department (HOSPITAL_COMMUNITY)
Admission: EM | Admit: 2014-12-12 | Discharge: 2014-12-12 | Disposition: A | Discharge status: Home / Self Care | Payer: Medicare Other | Attending: Emergency Medicine | Admitting: Emergency Medicine

## 2014-12-12 ENCOUNTER — Emergency Department (HOSPITAL_COMMUNITY): Payer: Medicare Other

## 2014-12-12 ENCOUNTER — Encounter (HOSPITAL_COMMUNITY): Payer: Self-pay | Admitting: *Deleted

## 2014-12-12 DIAGNOSIS — Z792 Long term (current) use of antibiotics: Secondary | ICD-10-CM | POA: Diagnosis not present

## 2014-12-12 DIAGNOSIS — I2510 Atherosclerotic heart disease of native coronary artery without angina pectoris: Secondary | ICD-10-CM | POA: Insufficient documentation

## 2014-12-12 DIAGNOSIS — Z87891 Personal history of nicotine dependence: Secondary | ICD-10-CM | POA: Insufficient documentation

## 2014-12-12 DIAGNOSIS — R404 Transient alteration of awareness: Secondary | ICD-10-CM

## 2014-12-12 DIAGNOSIS — I509 Heart failure, unspecified: Secondary | ICD-10-CM | POA: Diagnosis not present

## 2014-12-12 DIAGNOSIS — I10 Essential (primary) hypertension: Secondary | ICD-10-CM | POA: Insufficient documentation

## 2014-12-12 DIAGNOSIS — Z85828 Personal history of other malignant neoplasm of skin: Secondary | ICD-10-CM | POA: Insufficient documentation

## 2014-12-12 DIAGNOSIS — J189 Pneumonia, unspecified organism: Secondary | ICD-10-CM

## 2014-12-12 DIAGNOSIS — R0602 Shortness of breath: Secondary | ICD-10-CM | POA: Diagnosis not present

## 2014-12-12 DIAGNOSIS — J159 Unspecified bacterial pneumonia: Secondary | ICD-10-CM | POA: Diagnosis not present

## 2014-12-12 DIAGNOSIS — Z7982 Long term (current) use of aspirin: Secondary | ICD-10-CM | POA: Insufficient documentation

## 2014-12-12 DIAGNOSIS — R531 Weakness: Secondary | ICD-10-CM | POA: Diagnosis not present

## 2014-12-12 DIAGNOSIS — Z8669 Personal history of other diseases of the nervous system and sense organs: Secondary | ICD-10-CM | POA: Diagnosis not present

## 2014-12-12 DIAGNOSIS — F0390 Unspecified dementia without behavioral disturbance: Secondary | ICD-10-CM | POA: Diagnosis not present

## 2014-12-12 DIAGNOSIS — Z79899 Other long term (current) drug therapy: Secondary | ICD-10-CM | POA: Diagnosis not present

## 2014-12-12 LAB — CBC WITH DIFFERENTIAL/PLATELET
Basophils Absolute: 0 10*3/uL (ref 0.0–0.1)
Basophils Relative: 0 % (ref 0–1)
EOS ABS: 0.2 10*3/uL (ref 0.0–0.7)
Eosinophils Relative: 2 % (ref 0–5)
HCT: 40.9 % (ref 39.0–52.0)
Hemoglobin: 13.1 g/dL (ref 13.0–17.0)
LYMPHS ABS: 1.4 10*3/uL (ref 0.7–4.0)
Lymphocytes Relative: 14 % (ref 12–46)
MCH: 29.8 pg (ref 26.0–34.0)
MCHC: 32 g/dL (ref 30.0–36.0)
MCV: 93 fL (ref 78.0–100.0)
Monocytes Absolute: 1 10*3/uL (ref 0.1–1.0)
Monocytes Relative: 10 % (ref 3–12)
NEUTROS PCT: 74 % (ref 43–77)
Neutro Abs: 7.1 10*3/uL (ref 1.7–7.7)
Platelets: 296 10*3/uL (ref 150–400)
RBC: 4.4 MIL/uL (ref 4.22–5.81)
RDW: 14.6 % (ref 11.5–15.5)
WBC: 9.7 10*3/uL (ref 4.0–10.5)

## 2014-12-12 LAB — URINE MICROSCOPIC-ADD ON

## 2014-12-12 LAB — URINALYSIS, ROUTINE W REFLEX MICROSCOPIC
Bilirubin Urine: NEGATIVE
Glucose, UA: NEGATIVE mg/dL
KETONES UR: NEGATIVE mg/dL
Leukocytes, UA: NEGATIVE
Nitrite: NEGATIVE
PROTEIN: NEGATIVE mg/dL
Specific Gravity, Urine: 1.018 (ref 1.005–1.030)
UROBILINOGEN UA: 1 mg/dL (ref 0.0–1.0)
pH: 5 (ref 5.0–8.0)

## 2014-12-12 LAB — I-STAT TROPONIN, ED: TROPONIN I, POC: 0.01 ng/mL (ref 0.00–0.08)

## 2014-12-12 LAB — BASIC METABOLIC PANEL
ANION GAP: 11 (ref 5–15)
BUN: 19 mg/dL (ref 6–23)
CO2: 26 mEq/L (ref 19–32)
Calcium: 9.1 mg/dL (ref 8.4–10.5)
Chloride: 104 mEq/L (ref 96–112)
Creatinine, Ser: 1.14 mg/dL (ref 0.50–1.35)
GFR calc Af Amer: 63 mL/min — ABNORMAL LOW (ref >90)
GFR, EST NON AFRICAN AMERICAN: 55 mL/min — AB (ref >90)
GLUCOSE: 100 mg/dL — AB (ref 70–99)
POTASSIUM: 4.6 meq/L (ref 3.7–5.3)
Sodium: 141 mEq/L (ref 137–147)

## 2014-12-12 LAB — I-STAT CG4 LACTIC ACID, ED: Lactic Acid, Venous: 1.23 mmol/L (ref 0.5–2.2)

## 2014-12-12 LAB — PRO B NATRIURETIC PEPTIDE: PRO B NATRI PEPTIDE: 16134 pg/mL — AB (ref 0–450)

## 2014-12-12 MED ORDER — IOHEXOL 300 MG/ML  SOLN
80.0000 mL | Freq: Once | INTRAMUSCULAR | Status: AC | PRN
  Administered 2014-12-12: 80 mL via INTRAVENOUS

## 2014-12-12 MED ORDER — POTASSIUM CHLORIDE ER 10 MEQ PO TBCR: 10.0000 meq | EXTENDED_RELEASE_TABLET | Freq: Two times a day (BID) | ORAL | Status: AC

## 2014-12-12 MED ORDER — FUROSEMIDE 20 MG PO TABS: 20.0000 mg | ORAL_TABLET | Freq: Every day | ORAL | Status: AC

## 2014-12-12 MED ORDER — LEVOFLOXACIN 500 MG PO TABS
500.0000 mg | ORAL_TABLET | Freq: Every day | ORAL | Status: AC
Start: 2014-12-12 — End: ?

## 2014-12-12 MED ORDER — LEVOFLOXACIN 750 MG PO TABS
750.0000 mg | ORAL_TABLET | Freq: Once | ORAL | Status: AC
  Administered 2014-12-12: 750 mg via ORAL
  Filled 2014-12-12: qty 1

## 2014-12-12 NOTE — ED Provider Notes (Signed)
CSN: [id number]     Arrival date & time 12/12/14  0919 History   First MD Initiated Contact with Patient 12/12/14 [phone]     Chief Complaint  Patient presents with  . Atrial Fibrillation     HPI  Patient presents via EMS from a memory care unit where he resides in individual room with his wife. Apparently, his wife got up and went to breakfast. The patient got up and was sitting in a chair. When he did not come to breakfast staff went to check on him. Apparently his head was down into the right. They had to "Shake him" to wake him up". They became concerned. EMS was summoned. Per first responders they  had to "sternal rub" his chest and he awakened. Upon arrival of EMS she is awake. Saturations between 86-90.  HR/monitor is irregular. Difficult to ascertain A. fib versus atrial rhythm with frequent PACs.  Past Medical History  Diagnosis Date  . Coronary artery disease   . Hypertension   . Angina at rest   . Hyperlipidemia   . CVA (cerebral infarction)     "at least one since 2011", denies residual (05/29/2013)  . BPH (benign prostatic hyperplasia)   . Idiopathic polyneuropathy   . Peripheral neuropathy   . Pneumonia     Nathan Willis 02/25/2001 (05/29/2013)  . Bursitis   . Skin cancer     "off face & arms" (05/29/2013)  . Dementia    Past Surgical History  Procedure Laterality Date  . Appendectomy  1987    Nathan Willis 02/25/2001 (05/29/2013)  . Inguinal hernia repair Right   . Coronary artery bypass graft  1997    Nathan Willis 02/25/2001 (05/29/2013)  . Transurethral resection of prostate  1993    Nathan Willis 02/25/2001 (05/29/2013)  . Coronary angioplasty    . Reconstructive repair sternal  02/2001    sternal rewiring/notes 02/25/2001 (05/29/2013)   No family history on file. History  Substance Use Topics  . Smoking status: Former Smoker -- 2.00 packs/day for 30 years    Types: Cigarettes  . Smokeless tobacco: Never Used     Comment: 05/29/2013 "been quit smoking for 30 yrs"  . Alcohol Use: No     Comment:  05/29/2013 "quit alcohol 30 yr ago"    Review of Systems  Unable to perform ROS: Dementia      Allergies  Baycol; Codeine; Lescol; Lipitor; Morphine and related; Zetia; and Zocor  Home Medications   Prior to Admission medications   Medication Sig Start Date End Date Taking? Authorizing Provider  acetaminophen (TYLENOL) 325 MG tablet Take 2 tablets (650 mg total) by mouth every 6 (six) hours as needed. 06/02/13  Yes [name] Leonie Green, MD  albuterol (PROVENTIL) (2.5 MG/3ML) 0.083% nebulizer solution Take 2.5 mg by nebulization every 8 (eight) hours as needed for wheezing or shortness of breath.   Yes Historical Provider, MD  aspirin EC 325 MG EC tablet Take 1 tablet (325 mg total) by mouth daily. 06/02/13  Yes Erline Hau, MD  finasteride (PROSCAR) 5 MG tablet Take 5 mg by mouth daily.   Yes Historical Provider, MD  isosorbide mononitrate (IMDUR) 60 MG 24 hr tablet Take 60 mg by mouth daily.   Yes Historical Provider, MD  liver oil-zinc oxide (DESITIN) 40 % ointment Apply 1 application topically 2 (two) times daily as needed for irritation.   Yes Historical Provider, MD  loperamide (IMODIUM) 2 MG capsule Take 2 mg by mouth as needed for diarrhea or  loose stools.   Yes Historical Provider, MD  LORazepam (ATIVAN) 0.5 MG tablet Take 0.5 mg by mouth every 8 (eight) hours as needed (for agitation).   Yes Historical Provider, MD  metoprolol succinate (TOPROL-XL) 25 MG 24 hr tablet Take 25 mg by mouth daily.   Yes Historical Provider, MD  Multiple Vitamins-Minerals (DECUBI-VITE) CAPS Take 1 capsule by mouth daily.    Yes Historical Provider, MD  neomycin-bacitracin-polymyxin (NEOSPORIN) [phone] ointment Apply 1 application topically as needed (for skin tear).   Yes Historical Provider, MD  pravastatin (PRAVACHOL) 20 MG tablet Take 20 mg by mouth daily.   Yes Historical Provider, MD  white petrolatum (VASELINE) GEL Apply 1 application topically as needed for lip care or dry  skin.   Yes Historical Provider, MD  furosemide (LASIX) 20 MG tablet Take 1 tablet (20 mg total) by mouth daily. 12/12/14   Tanna Furry, MD  levofloxacin (LEVAQUIN) 500 MG tablet Take 1 tablet (500 mg total) by mouth daily. 12/12/14   Tanna Furry, MD  potassium chloride (K-DUR) 10 MEQ tablet Take 1 tablet (10 mEq total) by mouth 2 (two) times daily. 12/12/14   Tanna Furry, MD   BP 134/71 mmHg  Pulse 80  Temp(Src) 97.7 F (36.5 C) (Oral)  Resp 23  SpO2 94% Physical Exam  Constitutional: No distress.  HENT:  Head: Normocephalic.  Eyes: Conjunctivae are normal. Pupils are equal, round, and reactive to light. No scleral icterus.  Neck: Normal range of motion. Neck supple. No thyromegaly present.  Cardiovascular: Normal rate.  An irregular rhythm present. Exam reveals no gallop and no friction rub.   No murmur heard. Irregular rhythm. Rate controlled in the 70s to 80s.  Pulmonary/Chest: Effort normal and breath sounds normal. No respiratory distress. He has no wheezes. He has no rales.    Abdominal: Soft. Bowel sounds are normal. He exhibits no distension. There is no tenderness. There is no rebound.  Musculoskeletal: Normal range of motion.  Neurological: He is alert.  Skin: Skin is warm and dry. No rash noted.  Psychiatric: He has a normal mood and affect. His behavior is normal.    ED Course  Procedures (including critical care time) Labs Review Labs Reviewed  BASIC METABOLIC PANEL - Abnormal; Notable for the following:    Glucose, Bld 100 (*)    GFR calc non Af Amer 55 (*)    GFR calc Af Amer 63 (*)    All other components within normal limits  URINALYSIS, ROUTINE W REFLEX MICROSCOPIC - Abnormal; Notable for the following:    Hgb urine dipstick SMALL (*)    All other components within normal limits  PRO B NATRIURETIC PEPTIDE - Abnormal; Notable for the following:    Pro B Natriuretic peptide (BNP) 16134.0 (*)    All other components within normal limits  CBC WITH DIFFERENTIAL   URINE MICROSCOPIC-ADD ON  Randolm Idol, ED  I-STAT CG4 LACTIC ACID, ED    Imaging Review Dg Chest 2 View  12/12/2014   CLINICAL DATA:  Cough, weakness  EXAM: CHEST  2 VIEW  COMPARISON:  04/07/2014  FINDINGS: Cardiomegaly with pulmonary vascular congestion and possible mild interstitial edema.  Moderate to large left pleural effusion. Mild right pleural effusion.  Associated lower lobe opacities, likely compressive atelectasis. No pneumothorax.  Postsurgical changes related to prior CABG.  Mild degenerative changes of the visualized thoracolumbar spine.  IMPRESSION: Cardiomegaly with pulmonary vascular congestion and possible mild interstitial edema.  Moderate to large left pleural effusion. Mild  right pleural effusion.  Associated lower lobe opacities, likely compressive atelectasis.   Electronically Signed   By: Julian Hy M.D.   On: 12/12/2014 10:18   Ct Head Wo Contrast  12/12/2014   CLINICAL DATA:  Altered level of consciousness  EXAM: CT HEAD WITHOUT CONTRAST  TECHNIQUE: Contiguous axial images were obtained from the base of the skull through the vertex without intravenous contrast.  COMPARISON:  04/09/2014  FINDINGS: No evidence of parenchymal hemorrhage or extra-axial fluid collection. No mass lesion, mass effect, or midline shift.  No CT evidence of acute infarction.  Encephalomalacic changes in the subcortical left frontal lobe.  Subcortical white matter and periventricular small vessel ischemic changes. Intracranial atherosclerosis.  Global cortical atrophy.  No ventriculomegaly.  The visualized paranasal sinuses are essentially clear. The mastoid air cells are unopacified.  No evidence of calvarial fracture.  IMPRESSION: No evidence of acute intracranial abnormality.  Encephalomalacic changes in the left frontal lobe.  Atrophy with small vessel ischemic changes and intracranial atherosclerosis.   Electronically Signed   By: Julian Hy M.D.   On: 12/12/2014 10:24   Ct  Chest W Contrast  12/12/2014   CLINICAL DATA:  Shortness of breath and pleural effusions  EXAM: CT CHEST WITH CONTRAST  TECHNIQUE: Multidetector CT imaging of the chest was performed during intravenous contrast administration.  CONTRAST:  57mL OMNIPAQUE IOHEXOL 300 MG/ML  SOLN  COMPARISON:  Chest radiograph December 12, 2014; chest CT February 25, 2007  FINDINGS: There are moderate pleural effusions bilaterally, free-flowing. There is consolidation in both lower lobes, more severe on the left than on the right.  There is a nodular lesion in the anterior segment of the left upper lobe with irregular borders measuring 1.3 x 1.1 cm. This lesion is best seen on slice 11 series 3.  There is cardiomegaly. The left atrium is enlarged somewhat disproportionately with respect to the other chambers. There is no appreciable pericardial effusion. There are multiple foci of coronary artery calcification. Patient is status post coronary artery bypass grafting.  There is no appreciable thoracic adenopathy. Thyroid appears normal. There is no thoracic aortic aneurysm or dissection. There is atherosclerotic change in aorta. There is no demonstrable pulmonary embolus.  In the visualized upper abdomen, gallbladder is absent. There is atherosclerotic change in the upper abdominal aorta. There is degenerative type change throughout the thoracic spine. There are no blastic or lytic bone lesions. There is thoracic dextroscoliosis.  IMPRESSION: There is a 1.3 x 1.1 cm slightly irregular nodular lesion in the anterior segment of the right upper lobe near the apex. Small neoplasm must be of concern given this finding. This finding may warrant correlation with nuclear medicine PET study to further assess.  Cardiomegaly with bilateral effusions. Suspect underlying congestive heart failure. There is lower lobe consolidation bilaterally, more on the left than on the right.  Multiple foci of atherosclerotic change.  No demonstrable adenopathy.    Electronically Signed   By: Lowella Grip M.D.   On: 12/12/2014 12:35     EKG Interpretation   Date/Time:  Sunday December 12 2014 09:26:20 EST Ventricular Rate:  80 PR Interval:  108 QRS Duration: 101 QT Interval:  411 QTC Calculation: 474 R Axis:   -52 Text Interpretation:  Sinus rhythm with PACs Left anterior fascicular  block Anterior infarct, old Nonspecific T abnormalities, lateral leads  Confirmed by Jeneen Rinks  MD, Standard ([id number]) on 12/12/2014 9:35:38 AM      MDM   Final diagnoses:  Weakness  Altered level of consciousness  SOB (shortness of breath)  Chronic congestive heart failure, unspecified congestive heart failure type  Pneumonia, organism unspecified    Nonfocal neuro exam. He is awake and alert. Not hemodynamically unstable. Not hypoxemic here. Plan a CT scan, chest x-ray, screening labs. Troponin and BNP UA reevaluation.  On recheck, Nathan Willis is awake and alert and interactive. Family members. I state they feel he is at his baseline has been talkative and interactive with them. He has not been difficult to arouse, lethargic, or confused here. Saturations are 94-95% on room air. Initial EKG difficult to determine rhythm A. fib versus atrial bigeminy with PACs. In the room he shows sinus rhythm. His x-rays show basilar opacities. CT shows signs of congestive heart failure perhaps atelectasis versus infiltrates as well. He is not febrile. He is not hypoxemic. He has no increased work of breathing on exam. I think is appropriate for return to his facility. Today course Levaquin, ten-day course Lasix and potassium with close follow-up with his physician if not improving.    Tanna Furry, MD 12/12/14 [phone]

## 2014-12-12 NOTE — ED Notes (Signed)
Pt lives at [facility] in the memory care unit and staff found pt sitting in chair asleep but arousable to tactile stimuli and speaking with EMS at that point.  Initial spo2 was 86% on RA for FD.  Pt found in afib, unknown hx of this  Per ems

## 2014-12-12 NOTE — ED Notes (Signed)
Pt ate well, no distress

## 2014-12-12 NOTE — Discharge Instructions (Signed)
Please see your doctor within the next ten days for re-evaluation. Return to ER with worsening shortness of breath, or other changes.  Heart Failure Heart failure means your heart has trouble pumping blood. This makes it hard for your body to work well. Heart failure is usually a long-term (chronic) condition. You must take good care of yourself and follow your doctor's treatment plan. HOME CARE  Take your heart medicine as told by your doctor.  Do not stop taking medicine unless your doctor tells you to.  Do not skip any dose of medicine.  Refill your medicines before they run out.  Take other medicines only as told by your doctor or pharmacist.  Stay active if told by your doctor. The elderly and people with severe heart failure should talk with a doctor about physical activity.  Eat heart-healthy foods. Choose foods that are without trans fat and are low in saturated fat, cholesterol, and salt (sodium). This includes fresh or frozen fruits and vegetables, fish, lean meats, fat-free or low-fat dairy foods, whole grains, and high-fiber foods. Lentils and dried peas and beans (legumes) are also good choices.  Limit salt if told by your doctor.  Cook in a healthy way. Roast, grill, broil, bake, poach, steam, or stir-fry foods.  Limit fluids as told by your doctor.  Weigh yourself every morning. Do this after you pee (urinate) and before you eat breakfast. Write down your weight to give to your doctor.  Take your blood pressure and write it down if your doctor tells you to.  Ask your doctor how to check your pulse. Check your pulse as told.  Lose weight if told by your doctor.  Stop smoking or chewing tobacco. Do not use gum or patches that help you quit without your doctor's approval.  Schedule and go to doctor visits as told.  Nonpregnant women should have no more than 1 drink a day. Men should have no more than 2 drinks a day. Talk to your doctor about drinking  alcohol.  Stop illegal drug use.  Stay current with shots (immunizations).  Manage your health conditions as told by your doctor.  Learn to manage your stress.  Rest when you are tired.  If it is really hot outside:  Avoid intense activities.  Use air conditioning or fans, or get in a cooler place.  Avoid caffeine and alcohol.  Wear loose-fitting, lightweight, and light-colored clothing.  If it is really cold outside:  Avoid intense activities.  Layer your clothing.  Wear mittens or gloves, a hat, and a scarf when going outside.  Avoid alcohol.  Learn about heart failure and get support as needed.  Get help to maintain or improve your quality of life and your ability to care for yourself as needed. GET HELP IF:   You gain 03 lb/1.4 kg or more in 1 day or 05 lb/2.3 kg in a week.  You are more short of breath than usual.  You cannot do your normal activities.  You tire easily.  You cough more than normal, especially with activity.  You have any or more puffiness (swelling) in areas such as your hands, feet, ankles, or belly (abdomen).  You cannot sleep because it is hard to breathe.  You feel like your heart is beating fast (palpitations).  You get dizzy or light-headed when you stand up. GET HELP RIGHT AWAY IF:   You have trouble breathing.  There is a change in mental status, such as becoming less alert or  not being able to focus.  You have chest pain or discomfort.  You faint. MAKE SURE YOU:   Understand these instructions.  Will watch your condition.  Will get help right away if you are not doing well or get worse. Document Released: 09/18/2008 Document Revised: 04/26/2014 Document Reviewed: 01/26/2013 [facility] Patient Information 2015 [place], [place]. This information is not intended to replace advice given to you by your health care provider. Make sure you discuss any questions you have with your health care provider.  Pneumonia Pneumonia  is an infection of the lungs.  CAUSES Pneumonia may be caused by bacteria or a virus. Usually, these infections are caused by breathing infectious particles into the lungs (respiratory tract). SIGNS AND SYMPTOMS   Cough.  Fever.  Chest pain.  Increased rate of breathing.  Wheezing.  Mucus production. DIAGNOSIS  If you have the common symptoms of pneumonia, your health care provider will typically confirm the diagnosis with a chest X-ray. The X-ray will show an abnormality in the lung (pulmonary infiltrate) if you have pneumonia. Other tests of your blood, urine, or sputum may be done to find the specific cause of your pneumonia. Your health care provider may also do tests (blood gases or pulse oximetry) to see how well your lungs are working. TREATMENT  Some forms of pneumonia may be spread to other people when you cough or sneeze. You may be asked to wear a mask before and during your exam. Pneumonia that is caused by bacteria is treated with antibiotic medicine. Pneumonia that is caused by the influenza virus may be treated with an antiviral medicine. Most other viral infections must run their course. These infections will not respond to antibiotics.  HOME CARE INSTRUCTIONS   Cough suppressants may be used if you are losing too much rest. However, coughing protects you by clearing your lungs. You should avoid using cough suppressants if you can.  Your health care provider may have prescribed medicine if he or she thinks your pneumonia is caused by bacteria or influenza. Finish your medicine even if you start to feel better.  Your health care provider may also prescribe an expectorant. This loosens the mucus to be coughed up.  Take medicines only as directed by your health care provider.  Do not smoke. Smoking is a common cause of bronchitis and can contribute to pneumonia. If you are a smoker and continue to smoke, your cough may last several weeks after your pneumonia has  cleared.  A cold steam vaporizer or humidifier in your room or home may help loosen mucus.  Coughing is often worse at night. Sleeping in a semi-upright position in a recliner or using a couple pillows under your head will help with this.  Get rest as you feel it is needed. Your body will usually let you know when you need to rest. PREVENTION A pneumococcal shot (vaccine) is available to prevent a common bacterial cause of pneumonia. This is usually suggested for:  People over 23 years old.  Patients on chemotherapy.  People with chronic lung problems, such as bronchitis or emphysema.  People with immune system problems. If you are over 65 or have a high risk condition, you may receive the pneumococcal vaccine if you have not received it before. In some countries, a routine influenza vaccine is also recommended. This vaccine can help prevent some cases of pneumonia.You may be offered the influenza vaccine as part of your care. If you smoke, it is time to quit. You may  receive instructions on how to stop smoking. Your health care provider can provide medicines and counseling to help you quit. SEEK MEDICAL CARE IF: You have a fever. SEEK IMMEDIATE MEDICAL CARE IF:   Your illness becomes worse. This is especially true if you are elderly or weakened from any other disease.  You cannot control your cough with suppressants and are losing sleep.  You begin coughing up blood.  You develop pain which is getting worse or is uncontrolled with medicines.  Any of the symptoms which initially brought you in for treatment are getting worse rather than better.  You develop shortness of breath or chest pain. MAKE SURE YOU:   Understand these instructions.  Will watch your condition.  Will get help right away if you are not doing well or get worse. Document Released: 12/10/2005 Document Revised: 04/26/2014 Document Reviewed: 03/01/2011 [facility] Patient Information 2015 [place], [place]. This  information is not intended to replace advice given to you by your health care provider. Make sure you discuss any questions you have with your health care provider.

## 2014-12-12 NOTE — ED Notes (Signed)
Sounds like pt was unconscious at the nursing home but was "easily aroused by sternal rub once fire dep got there" and was at baseline at that time.

## 2014-12-12 NOTE — ED Notes (Signed)
Report given to alicia at [facility] and notifed her that pt will be returning, [facility] has been called, ETA unknown

## 2014-12-12 NOTE — ED Notes (Signed)
[facility] ARRIVED TO TRANSPORT PT.

## 2015-01-21 ENCOUNTER — Emergency Department (HOSPITAL_COMMUNITY): Payer: Medicare Other

## 2015-01-21 ENCOUNTER — Emergency Department (HOSPITAL_COMMUNITY)
Admission: EM | Admit: 2015-01-21 | Discharge: 2015-01-21 | Disposition: A | Discharge status: Home / Self Care | Payer: Medicare Other | Attending: Emergency Medicine | Admitting: Emergency Medicine

## 2015-01-21 ENCOUNTER — Encounter (HOSPITAL_COMMUNITY): Payer: Self-pay | Admitting: Emergency Medicine

## 2015-01-21 DIAGNOSIS — R079 Chest pain, unspecified: Secondary | ICD-10-CM | POA: Diagnosis not present

## 2015-01-21 DIAGNOSIS — F0390 Unspecified dementia without behavioral disturbance: Secondary | ICD-10-CM | POA: Insufficient documentation

## 2015-01-21 DIAGNOSIS — I10 Essential (primary) hypertension: Secondary | ICD-10-CM | POA: Insufficient documentation

## 2015-01-21 DIAGNOSIS — Z7982 Long term (current) use of aspirin: Secondary | ICD-10-CM | POA: Diagnosis not present

## 2015-01-21 DIAGNOSIS — Z8673 Personal history of transient ischemic attack (TIA), and cerebral infarction without residual deficits: Secondary | ICD-10-CM | POA: Diagnosis not present

## 2015-01-21 DIAGNOSIS — I25119 Atherosclerotic heart disease of native coronary artery with unspecified angina pectoris: Secondary | ICD-10-CM | POA: Diagnosis not present

## 2015-01-21 DIAGNOSIS — R609 Edema, unspecified: Secondary | ICD-10-CM | POA: Diagnosis not present

## 2015-01-21 DIAGNOSIS — Z8669 Personal history of other diseases of the nervous system and sense organs: Secondary | ICD-10-CM | POA: Insufficient documentation

## 2015-01-21 DIAGNOSIS — Z79899 Other long term (current) drug therapy: Secondary | ICD-10-CM | POA: Diagnosis not present

## 2015-01-21 DIAGNOSIS — N400 Enlarged prostate without lower urinary tract symptoms: Secondary | ICD-10-CM | POA: Insufficient documentation

## 2015-01-21 DIAGNOSIS — Z8739 Personal history of other diseases of the musculoskeletal system and connective tissue: Secondary | ICD-10-CM | POA: Insufficient documentation

## 2015-01-21 DIAGNOSIS — Z951 Presence of aortocoronary bypass graft: Secondary | ICD-10-CM | POA: Insufficient documentation

## 2015-01-21 DIAGNOSIS — Z9861 Coronary angioplasty status: Secondary | ICD-10-CM | POA: Diagnosis not present

## 2015-01-21 DIAGNOSIS — Z87891 Personal history of nicotine dependence: Secondary | ICD-10-CM | POA: Insufficient documentation

## 2015-01-21 DIAGNOSIS — R778 Other specified abnormalities of plasma proteins: Secondary | ICD-10-CM

## 2015-01-21 DIAGNOSIS — R7989 Other specified abnormal findings of blood chemistry: Secondary | ICD-10-CM | POA: Insufficient documentation

## 2015-01-21 DIAGNOSIS — Z85828 Personal history of other malignant neoplasm of skin: Secondary | ICD-10-CM | POA: Diagnosis not present

## 2015-01-21 DIAGNOSIS — R0602 Shortness of breath: Secondary | ICD-10-CM | POA: Insufficient documentation

## 2015-01-21 DIAGNOSIS — E785 Hyperlipidemia, unspecified: Secondary | ICD-10-CM | POA: Insufficient documentation

## 2015-01-21 DIAGNOSIS — R531 Weakness: Secondary | ICD-10-CM | POA: Diagnosis not present

## 2015-01-21 DIAGNOSIS — Z8701 Personal history of pneumonia (recurrent): Secondary | ICD-10-CM | POA: Diagnosis not present

## 2015-01-21 LAB — URINALYSIS, ROUTINE W REFLEX MICROSCOPIC
Bilirubin Urine: NEGATIVE
Glucose, UA: NEGATIVE mg/dL
Hgb urine dipstick: NEGATIVE
KETONES UR: NEGATIVE mg/dL
Leukocytes, UA: NEGATIVE
Nitrite: NEGATIVE
PH: 5 (ref 5.0–8.0)
Protein, ur: NEGATIVE mg/dL
Specific Gravity, Urine: 1.021 (ref 1.005–1.030)
Urobilinogen, UA: 0.2 mg/dL (ref 0.0–1.0)

## 2015-01-21 LAB — CBC WITH DIFFERENTIAL/PLATELET
Basophils Absolute: 0 10*3/uL (ref 0.0–0.1)
Basophils Relative: 0 % (ref 0–1)
Eosinophils Absolute: 0.2 10*3/uL (ref 0.0–0.7)
Eosinophils Relative: 2 % (ref 0–5)
HEMATOCRIT: 38.8 % — AB (ref 39.0–52.0)
Hemoglobin: 12.4 g/dL — ABNORMAL LOW (ref 13.0–17.0)
Lymphocytes Relative: 10 % — ABNORMAL LOW (ref 12–46)
Lymphs Abs: 1.2 10*3/uL (ref 0.7–4.0)
MCH: 30.2 pg (ref 26.0–34.0)
MCHC: 32 g/dL (ref 30.0–36.0)
MCV: 94.6 fL (ref 78.0–100.0)
MONO ABS: 1.2 10*3/uL — AB (ref 0.1–1.0)
Monocytes Relative: 11 % (ref 3–12)
NEUTROS PCT: 77 % (ref 43–77)
Neutro Abs: 8.8 10*3/uL — ABNORMAL HIGH (ref 1.7–7.7)
Platelets: 334 10*3/uL (ref 150–400)
RBC: 4.1 MIL/uL — AB (ref 4.22–5.81)
RDW: 14.9 % (ref 11.5–15.5)
WBC: 11.4 10*3/uL — ABNORMAL HIGH (ref 4.0–10.5)

## 2015-01-21 LAB — BRAIN NATRIURETIC PEPTIDE: B Natriuretic Peptide: 1737.6 pg/mL — ABNORMAL HIGH (ref 0.0–100.0)

## 2015-01-21 LAB — BASIC METABOLIC PANEL
Anion gap: 6 (ref 5–15)
BUN: 22 mg/dL (ref 6–23)
CALCIUM: 8.7 mg/dL (ref 8.4–10.5)
CHLORIDE: 106 mmol/L (ref 96–112)
CO2: 30 mmol/L (ref 19–32)
CREATININE: 1.5 mg/dL — AB (ref 0.50–1.35)
GFR calc Af Amer: 45 mL/min — ABNORMAL LOW (ref >90)
GFR calc non Af Amer: 39 mL/min — ABNORMAL LOW (ref >90)
Glucose, Bld: 119 mg/dL — ABNORMAL HIGH (ref 70–99)
Potassium: 3.8 mmol/L (ref 3.5–5.1)
Sodium: 142 mmol/L (ref 135–145)

## 2015-01-21 LAB — TROPONIN I: Troponin I: 0.06 ng/mL — ABNORMAL HIGH (ref <0.031)

## 2015-01-21 MED ORDER — ISOSORBIDE MONONITRATE ER 60 MG PO TB24: 60.0000 mg | ORAL_TABLET | Freq: Every day | ORAL | Status: AC

## 2015-01-21 NOTE — Discharge Instructions (Signed)
Stop prior prescription for Imdur (30mg ) and start new prescription (60mg ).  Monitor blood pressure closely while on increased dose. Return to the ED for new or worsening symptoms.

## 2015-01-21 NOTE — ED Notes (Signed)
Patient from [facility]. Patient was recently diagnosed with pneumonia and has been having central 5/10 CP for a couple days but states it got worse tonight. Patient reports SOB, sats are 95% on RA. Denies n/v. Reports generalized weakness.

## 2015-01-21 NOTE — Progress Notes (Signed)
[facility] ED Rm A06 Pt:[facility]) RN Visit Pt newly admitted 01/19/15 to [facility] services with diagnosis of Heart Failure; DNR code staus OOF DNR sent with pt; current [facility] Medication list placed on bedside table. Pt seen in ED Rm A06 siiting up on stretcher, awake, alert, pleasant, intermittently confused during conversation (stated I can't find my shoes); niece Caro Laroche at bedside.   Pt transported to ED from memory care unit at [facility] with complaints of chest pain and SOB. [facility] was not notified prior to pt transfer. Niece LouAnna voiced "I thought we were trying to avoid hospital visits" Discussed with niece that [facility] was notified after pt was transferred and [facility] team will follow up with facility staff. Niece stated she was waiting to speak with the physician and thought pt may return to the facility as he seems to be doing better. Currently pt denies any pain or SOB he stated "I felt short of breath this morning but feel better now"  Spoke with staff RN Abigail Butts and Dr Betsey Holiday who indicated once labs resulted will speak to niece. Niece aware [facility] will continue to follow and should pt be discharged [facility] will be made aware and follow-up at facility; she voiced understanding and appreciation.  [facility] contact information given to niece LouAnna during visit. Please contact [facility] at [phone] with any hospice needs. Thank you Danton Sewer, RN MSN [facility] Liaison [phone]

## 2015-01-21 NOTE — ED Provider Notes (Signed)
Patient presented to the ER with chest pain. Patient has reportedly been complaining of this pain for the last 5 days at the nursing home, but symptoms worsened today. He was recently diagnosed with pneumonia. Upon my evaluation, patient denies chest pain. He is not expressing any shortness of breath.  Face to face Exam: HEENT - PERRLA Lungs - CTAB Heart - RRR, no M/R/G Abd - S/NT/ND Neuro - alert, oriented x3  Plan: Repeat x-ray to rule out worsening pneumonia. Cardiac evaluation, if negative return to nursing home.   Orpah Greek, MD 01/21/15 [phone]

## 2015-01-21 NOTE — ED Provider Notes (Signed)
CSN: [id number]     Arrival date & time 01/21/15  8756 History   First MD Initiated Contact with Patient 01/21/15 [phone]     Chief Complaint  Patient presents with  . Chest Pain  . Shortness of Breath     (Consider location/radiation/quality/duration/timing/severity/associated sxs/prior Treatment) The history is provided by the patient, the EMS personnel and the nursing home.    LEVEL 5 CAVEAT:  DEMENTIA This is a 81 year old male with past medical history significant for hypertension, hyperlipidemia, coronary artery disease s/p CABG, BPH, dementia, presenting to the ED from [facility] for chest pain and SOB.  Per SNF patient has been experiencing centralized chest pain for the past few days but got worse tonight.  Patient notes he feels somewhat generally weak but denies current chest pain or SOB.  No dizziness or recent falls.  Patient was diagnosed with HCAP on 12/12/14 and completed 10 day course of Levaquin.  Patient denies recent cough.  No reported fever, chills, or sweats.  No abdominal pain, nausea, vomiting, or diarrhea.  Patient is followed by cardiology, Dr. Daneen Schick.  Most recent 2D echo on 05/31/13 with estimated 30-35% EF.  Patient is a DNR, golden ticket present with paperwork.  Past Medical History  Diagnosis Date  . Coronary artery disease   . Hypertension   . Angina at rest   . Hyperlipidemia   . CVA (cerebral infarction)     "at least one since 2011", denies residual (05/29/2013)  . BPH (benign prostatic hyperplasia)   . Idiopathic polyneuropathy   . Peripheral neuropathy   . Pneumonia     [name] 02/25/2001 (05/29/2013)  . Bursitis   . Skin cancer     "off face & arms" (05/29/2013)  . Dementia    Past Surgical History  Procedure Laterality Date  . Appendectomy  1987    [name] 02/25/2001 (05/29/2013)  . Inguinal hernia repair Right   . Coronary artery bypass graft  1997    [name] 02/25/2001 (05/29/2013)  . Transurethral resection of prostate  1993    [name] 02/25/2001  (05/29/2013)  . Coronary angioplasty    . Reconstructive repair sternal  02/2001    sternal rewiring/notes 02/25/2001 (05/29/2013)   No family history on file. History  Substance Use Topics  . Smoking status: Former Smoker -- 2.00 packs/day for 30 years    Types: Cigarettes  . Smokeless tobacco: Never Used     Comment: 05/29/2013 "been quit smoking for 30 yrs"  . Alcohol Use: No     Comment: 05/29/2013 "quit alcohol 30 yr ago"    Review of Systems  Unable to perform ROS: Dementia      Allergies  Baycol; Codeine; Lescol; Lipitor; Morphine and related; Zetia; and Zocor  Home Medications   Prior to Admission medications   Medication Sig Start Date End Date Taking? Authorizing Provider  acetaminophen (TYLENOL) 325 MG tablet Take 2 tablets (650 mg total) by mouth every 6 (six) hours as needed. 06/02/13   Erline Hau, MD  albuterol (PROVENTIL) (2.5 MG/3ML) 0.083% nebulizer solution Take 2.5 mg by nebulization every 8 (eight) hours as needed for wheezing or shortness of breath.    Historical Provider, MD  aspirin EC 325 MG EC tablet Take 1 tablet (325 mg total) by mouth daily. 06/02/13   Erline Hau, MD  finasteride (PROSCAR) 5 MG tablet Take 5 mg by mouth daily.    Historical Provider, MD  furosemide (LASIX) 20 MG tablet Take 1 tablet (  20 mg total) by mouth daily. 12/12/14   Tanna Furry, MD  isosorbide mononitrate (IMDUR) 60 MG 24 hr tablet Take 60 mg by mouth daily.    Historical Provider, MD  levofloxacin (LEVAQUIN) 500 MG tablet Take 1 tablet (500 mg total) by mouth daily. 12/12/14   Tanna Furry, MD  liver oil-zinc oxide (DESITIN) 40 % ointment Apply 1 application topically 2 (two) times daily as needed for irritation.    Historical Provider, MD  loperamide (IMODIUM) 2 MG capsule Take 2 mg by mouth as needed for diarrhea or loose stools.    Historical Provider, MD  LORazepam (ATIVAN) 0.5 MG tablet Take 0.5 mg by mouth every 8 (eight) hours as needed (for agitation).     Historical Provider, MD  metoprolol succinate (TOPROL-XL) 25 MG 24 hr tablet Take 25 mg by mouth daily.    Historical Provider, MD  Multiple Vitamins-Minerals (DECUBI-VITE) CAPS Take 1 capsule by mouth daily.     Historical Provider, MD  neomycin-bacitracin-polymyxin (NEOSPORIN) [phone] ointment Apply 1 application topically as needed (for skin tear).    Historical Provider, MD  potassium chloride (K-DUR) 10 MEQ tablet Take 1 tablet (10 mEq total) by mouth 2 (two) times daily. 12/12/14   Tanna Furry, MD  pravastatin (PRAVACHOL) 20 MG tablet Take 20 mg by mouth daily.    Historical Provider, MD  white petrolatum (VASELINE) GEL Apply 1 application topically as needed for lip care or dry skin.    Historical Provider, MD   BP 122/81 mmHg  Pulse 84  Temp(Src) 97.8 F (36.6 C) (Oral)  Resp 23  SpO2 97%   Physical Exam  Constitutional: He appears well-developed and well-nourished. No distress.  HENT:  Head: Normocephalic and atraumatic.  Mouth/Throat: Oropharynx is clear and moist.  Eyes: Conjunctivae and EOM are normal. Pupils are equal, round, and reactive to light.  Neck: Normal range of motion. Neck supple.  Cardiovascular: Normal rate, regular rhythm and normal heart sounds.   Pulmonary/Chest: Effort normal and breath sounds normal. No respiratory distress. He has no wheezes.  Abdominal: Soft. Bowel sounds are normal. There is no tenderness. There is no guarding.  Musculoskeletal: Normal range of motion. He exhibits edema (trace).  Neurological: He is alert.  Awake, alert, answering most questions and following commands when prompted  Skin: Skin is warm and dry. He is not diaphoretic.  Psychiatric: He has a normal mood and affect.  Nursing note and vitals reviewed.   ED Course  Procedures (including critical care time) Labs Review Labs Reviewed  CBC WITH DIFFERENTIAL/PLATELET - Abnormal; Notable for the following:    WBC 11.4 (*)    RBC 4.10 (*)    Hemoglobin 12.4 (*)    HCT  38.8 (*)    Neutro Abs 8.8 (*)    Lymphocytes Relative 10 (*)    Monocytes Absolute 1.2 (*)    All other components within normal limits  BASIC METABOLIC PANEL - Abnormal; Notable for the following:    Glucose, Bld 119 (*)    Creatinine, Ser 1.50 (*)    GFR calc non Af Amer 39 (*)    GFR calc Af Amer 45 (*)    All other components within normal limits  BRAIN NATRIURETIC PEPTIDE - Abnormal; Notable for the following:    B Natriuretic Peptide 1737.6 (*)    All other components within normal limits  TROPONIN I - Abnormal; Notable for the following:    Troponin I 0.06 (*)    All other components within  normal limits  TROPONIN I  URINALYSIS, ROUTINE W REFLEX MICROSCOPIC    Imaging Review Dg Chest 2 View  01/21/2015   CLINICAL DATA:  Chest pain with cough and congestion  EXAM: CHEST  2 VIEW  COMPARISON:  12/12/2014  FINDINGS: Cardiac shadow is stable. Postsurgical changes are again seen. Bilateral pleural effusions left considerably greater than right are again seen and stable. Bibasilar atelectatic changes are again seen. The known right upper lobe pulmonary nodule is superimposed over the first rib anteriorly. No new focal infiltrate is noted  IMPRESSION: Bilateral pleural effusions stable from the prior exam. The overall appearance is stable from the previous study   Electronically Signed   By: Inez Catalina M.D.   On: 01/21/2015 07:26     EKG Interpretation   Date/Time:  Friday January 21 2015 06:32:59 EST Ventricular Rate:  87 PR Interval:  169 QRS Duration: 94 QT Interval:  415 QTC Calculation: 499 R Axis:   -16 Text Interpretation:  Sinus or ectopic atrial rhythm Paired ventricular  premature complexes Borderline left axis deviation Anterior infarct, old  Nonspecific T abnormalities, lateral leads No significant change since  last tracing Confirmed by [facility]  MD, CHRISTOPHER [phone]) on 01/21/2015  7:10:15 AM      MDM   Final diagnoses:  Chest pain  SOB (shortness of  breath)  Elevated troponin   81 y.o. M with alleged chest pain and SOB over the past 5 days, however patient is asymptomatic on arrival.  He states he does have some generalized weakness.  EKG sinus rhythm with PVCs, largely unchanged from previous.  Lab work obtained which appears baseline for patient when compared with previous, troponin negative. BNP elevated at 1737 however Chest x-ray is clear without evidence of pulmonary vascular congestion or pulmonary edema.  Patient does have hx of CAD s/p CABG and NSTEMI.  Will obtain delta troponin.  Delta troponin slightly elevated at 0.06. Patient without current chest pain and likely not a candidate for aggressive intervention given his age and DNR status.  Will discuss with cardiology for recommendations.  11:00 AM Case discussed with cardiology, Dr. Jeffie Pollock who has reviewed labs and EKG.  No significant change in EKG from previous, minimally positive troponin.  Patient is currently in hospice.  Does not recommend admission at this time, aim for comfort care.  Patient is currently on Imdur, will increase his dose to help alleviate pain.  This was discussed with family members at bedside who is in agreement for comfort care.  Hospice nurse also updated on plan.  New Rx for imdur written.  Patient to FU with PCP.  Discussed plan with patient, he/she acknowledged understanding and agreed with plan of care.  Return precautions given for new or worsening symptoms.  Case discussed with attending physician, Dr. Betsey Holiday, who evaluated patient and agrees with assessment and plan of care.  Larene Pickett, PA-C 01/21/15 [place], MD 01/29/15 [phone]

## 2015-01-21 NOTE — ED Notes (Signed)
Verified with [facility], PA pt to be transported to nursing facility, PA aware of Troponin

## 2015-01-21 NOTE — ED Notes (Signed)
[facility] RN at bedside

## 2015-01-21 NOTE — ED Notes (Signed)
Pt confused attempting to get out of bed. Reoriented patient to surroundings. Offered toileting.

## 2015-01-26 IMAGING — CT CT HEAD W/O CM
4 of 6 series · 16 of 47 positions shown, 18 images · non-contrast
Comparison: Head CT from 02/28/2011

CT HEAD

CLINICAL DATA: Found down

CT HEAD WITHOUT CONTRAST
CT CERVICAL SPINE WITHOUT CONTRAST
TECHNIQUE: Multidetector CT imaging of the head and cervical spine
was performed following the standard protocol without intravenous
contrast.  Multiplanar CT image reconstructions of the cervical
spine were also generated.

[Series 3: head trauma 4.8 h37s · axial · 0.46mm/px · z∈[-90,-31]mm · 2 of 36 slices shown]
[im 12/36  brain]
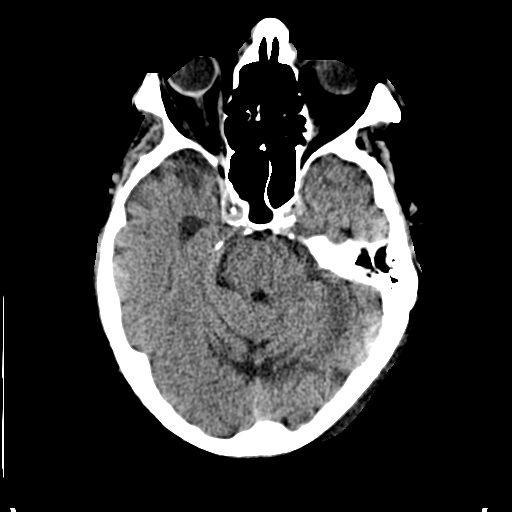
[im 24/36  brain]
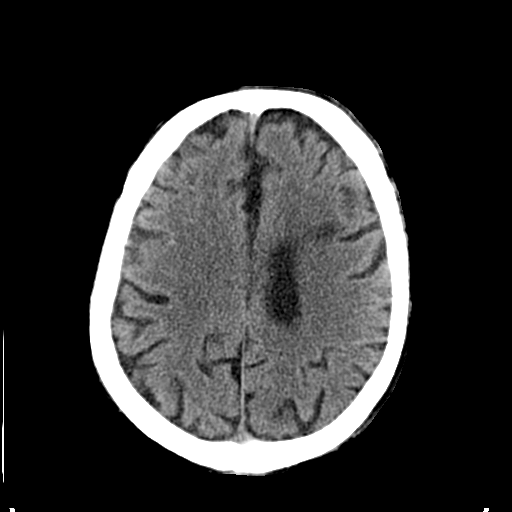

[Series 602: cor · coronal · 0.35mm/px · 3 of 41 slices shown]
[im 14/41  brain]
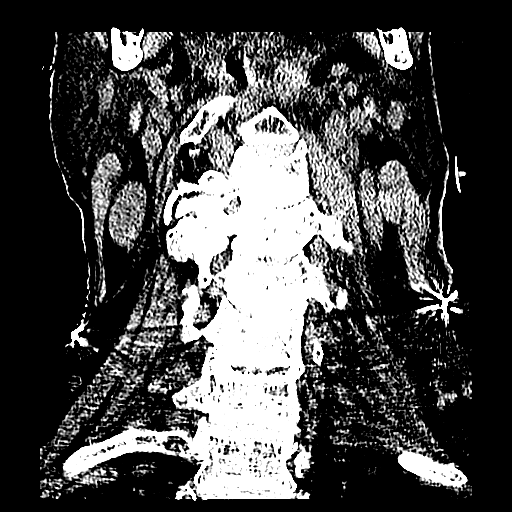
[im 18/41  brain]
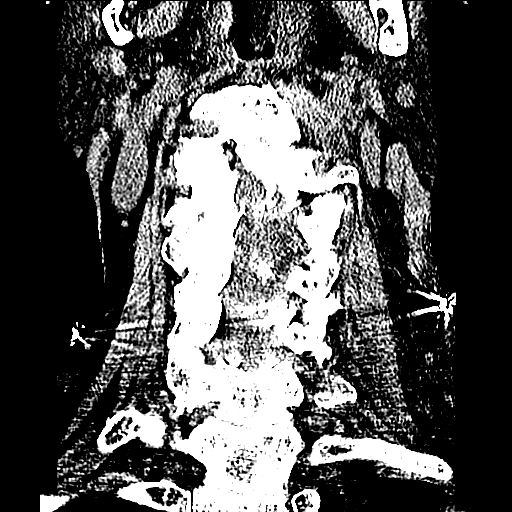
[im 23/41  brain]
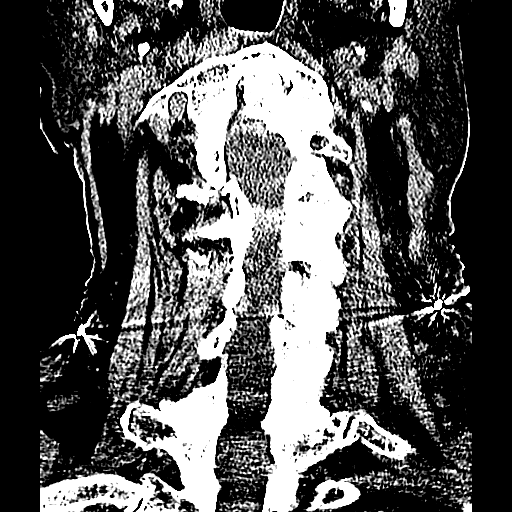

[Series 603: axials · axial · 0.35mm/px · z∈[-330,-196]mm · 8 of 99 slices shown, 10 images]
[im 11/99  brain]
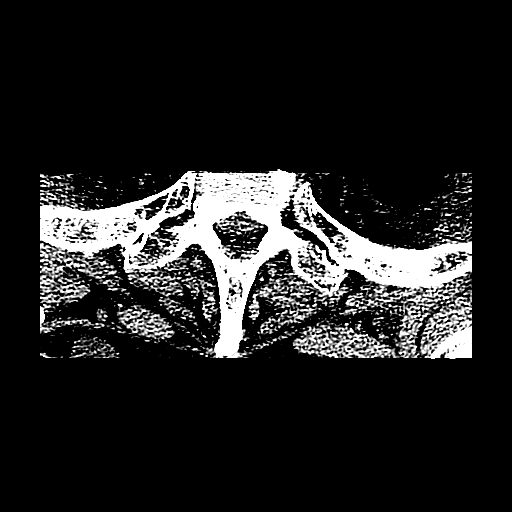
[im 11/99  bone]
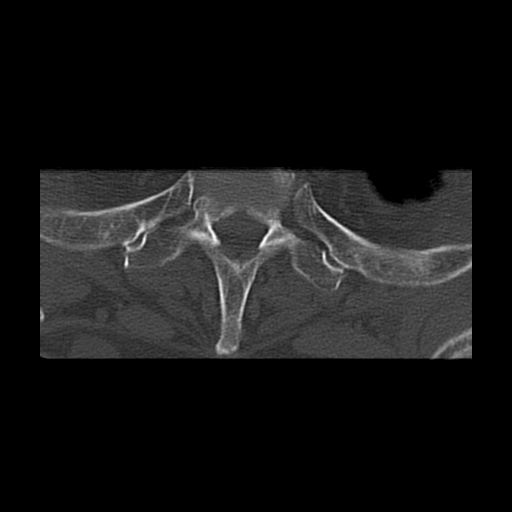
[im 22/99  brain]
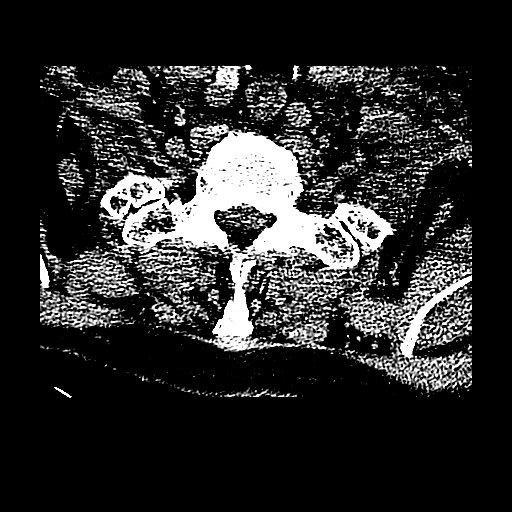
[im 33/99  brain]
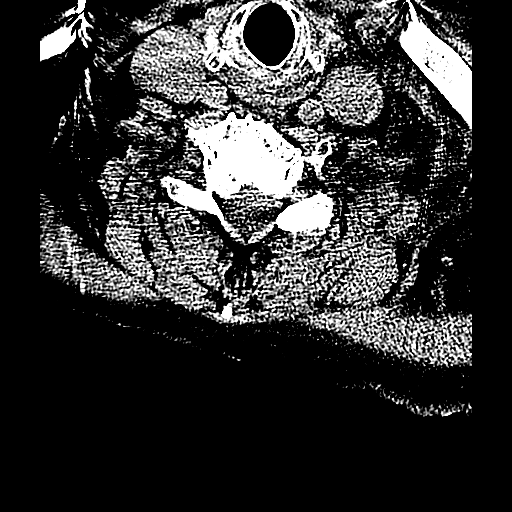
[im 44/99  brain]
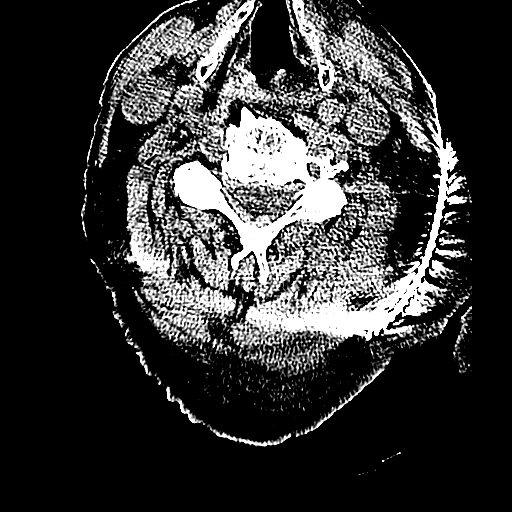
[im 55/99  brain]
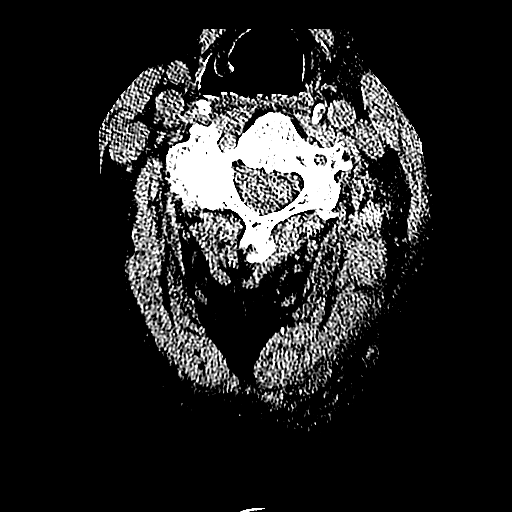
[im 55/99  bone]
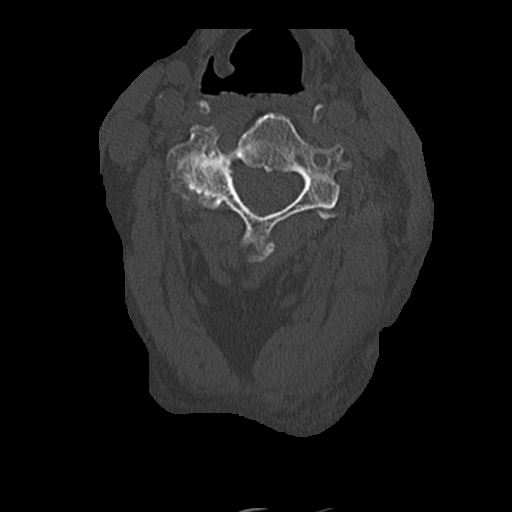
[im 66/99  brain]
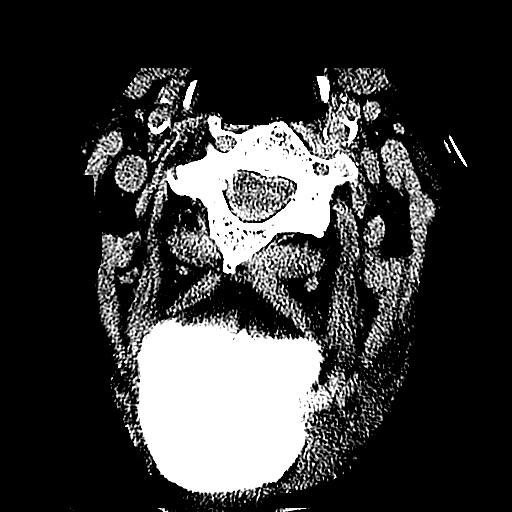
[im 77/99  brain]
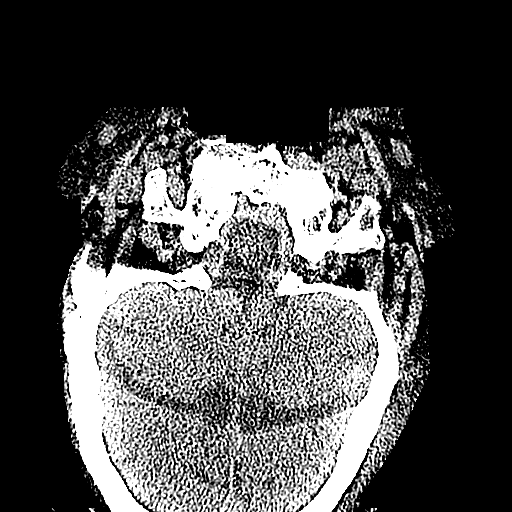
[im 88/99  brain]
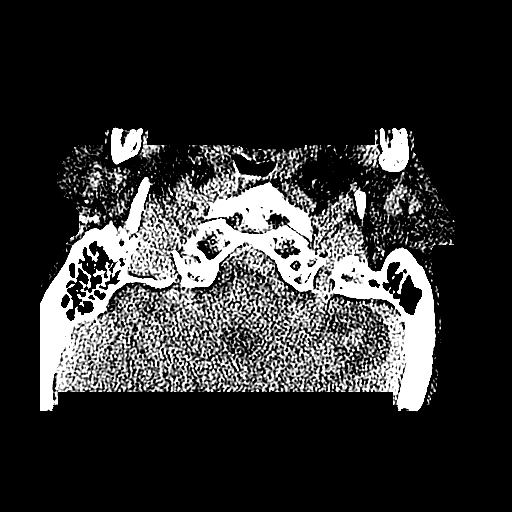

[Series 604: sag · sagittal · 0.35mm/px · 3 of 42 slices shown]
[im 14/42  brain]
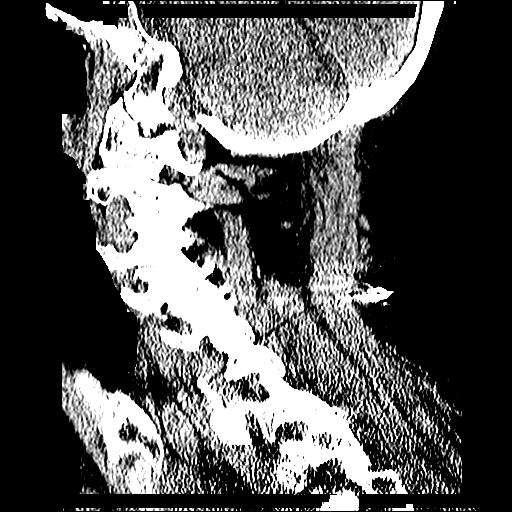
[im 21/42  brain]
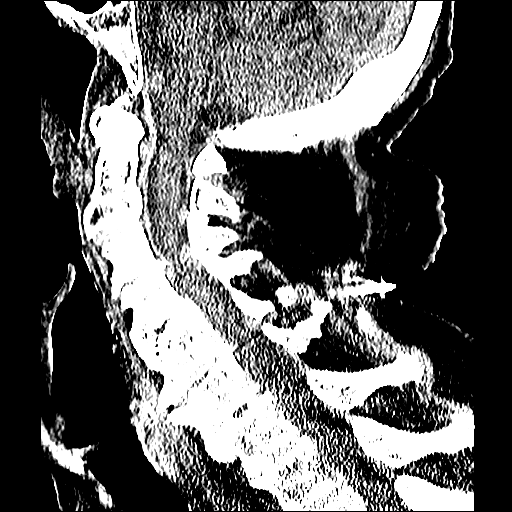
[im 28/42  brain]
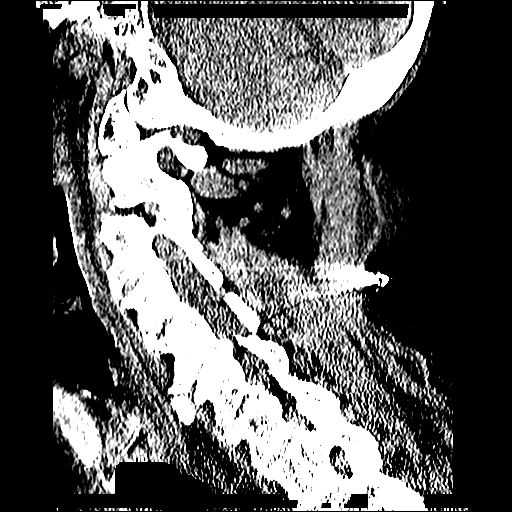

[16 of 47 positions shown; findings below may reference images not displayed]

FINDINGS: There is no evidence for acute hemorrhage, hydrocephalus,
mass lesion, or abnormal extra-axial fluid collection.  No definite
CT evidence for acute infarction.  Diffuse loss of parenchymal
volume is consistent with atrophy.  Focal nonacute infarct in the
posterior left frontal lobe is new in the interval since the prior
study. Patchy low attenuation in the deep hemispheric and
periventricular white matter is nonspecific, but likely reflects
chronic microvascular ischemic demyelination.

The visualized paranasal sinuses and mastoid air cells are clear.
IMPRESSION: No acute intracranial abnormality.

Atrophy with chronic small vessel white matter ischemic
demyelination.

Nonacute infarct involving the posterior left frontal lobe.

CT CERVICAL SPINE
FINDINGS: Imaging was obtained from the skull base to the C7-T1
interspace.  No evidence for fracture.  There is loss of
intervertebral disc space from C3-4 down to C6-7 with associated
endplate degeneration.  Diffuse facet osteoarthritis is also noted
bilaterally, but most prominent at CT 3N C3-4 on the right.  No
evidence for prevertebral soft tissue swelling.

Partial imaging of the left apex reveals a pleural effusion.
IMPRESSION: Diffuse degenerative changes in the cervical spine without evidence
for an acute fracture.

Left pleural effusion.

## 2015-01-26 IMAGING — CR DG SHOULDER 2+V*L*
3 series · 3 of 3 positions shown · non-contrast
Comparison: No priors.

CLINICAL DATA: Left shoulder and hand abrasions after a fall.

LEFT SHOULDER - 2+ VIEW

[x shoulder ap left]
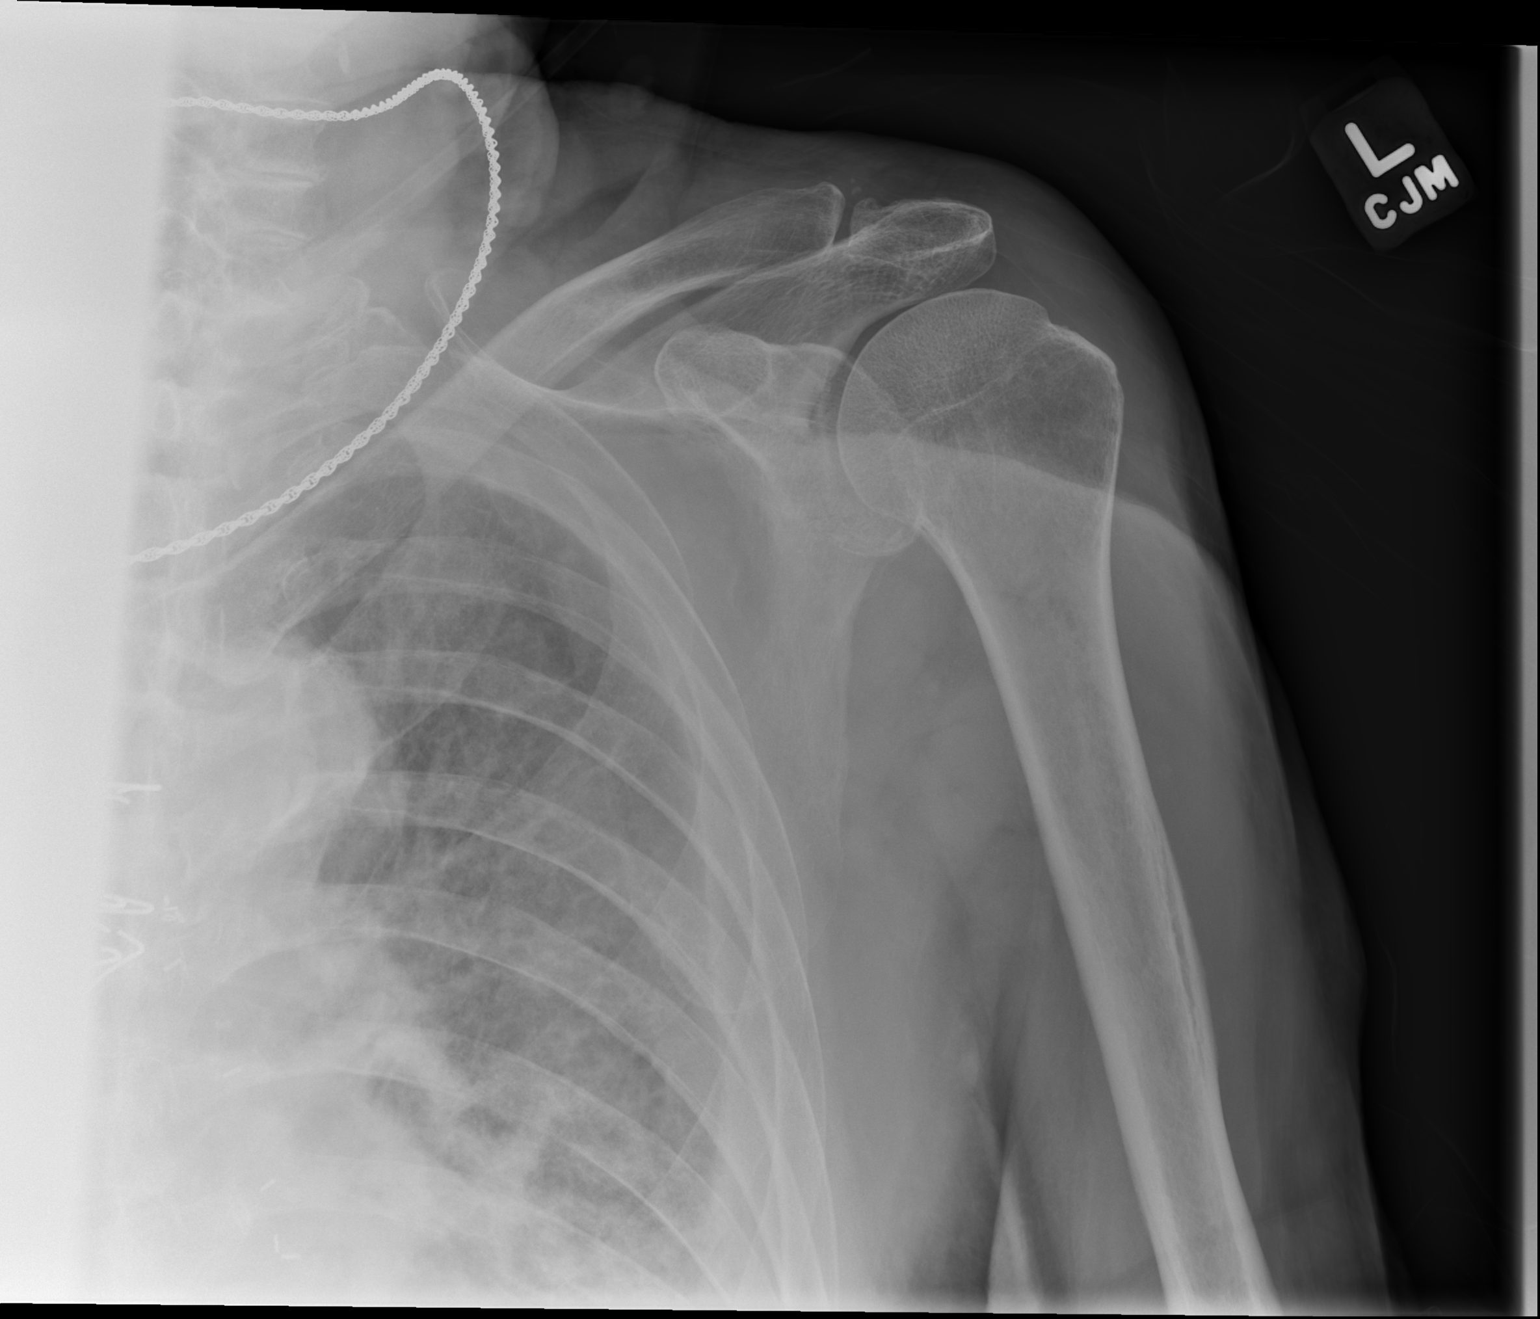

[x shoulder axillary left (1 of 2)]
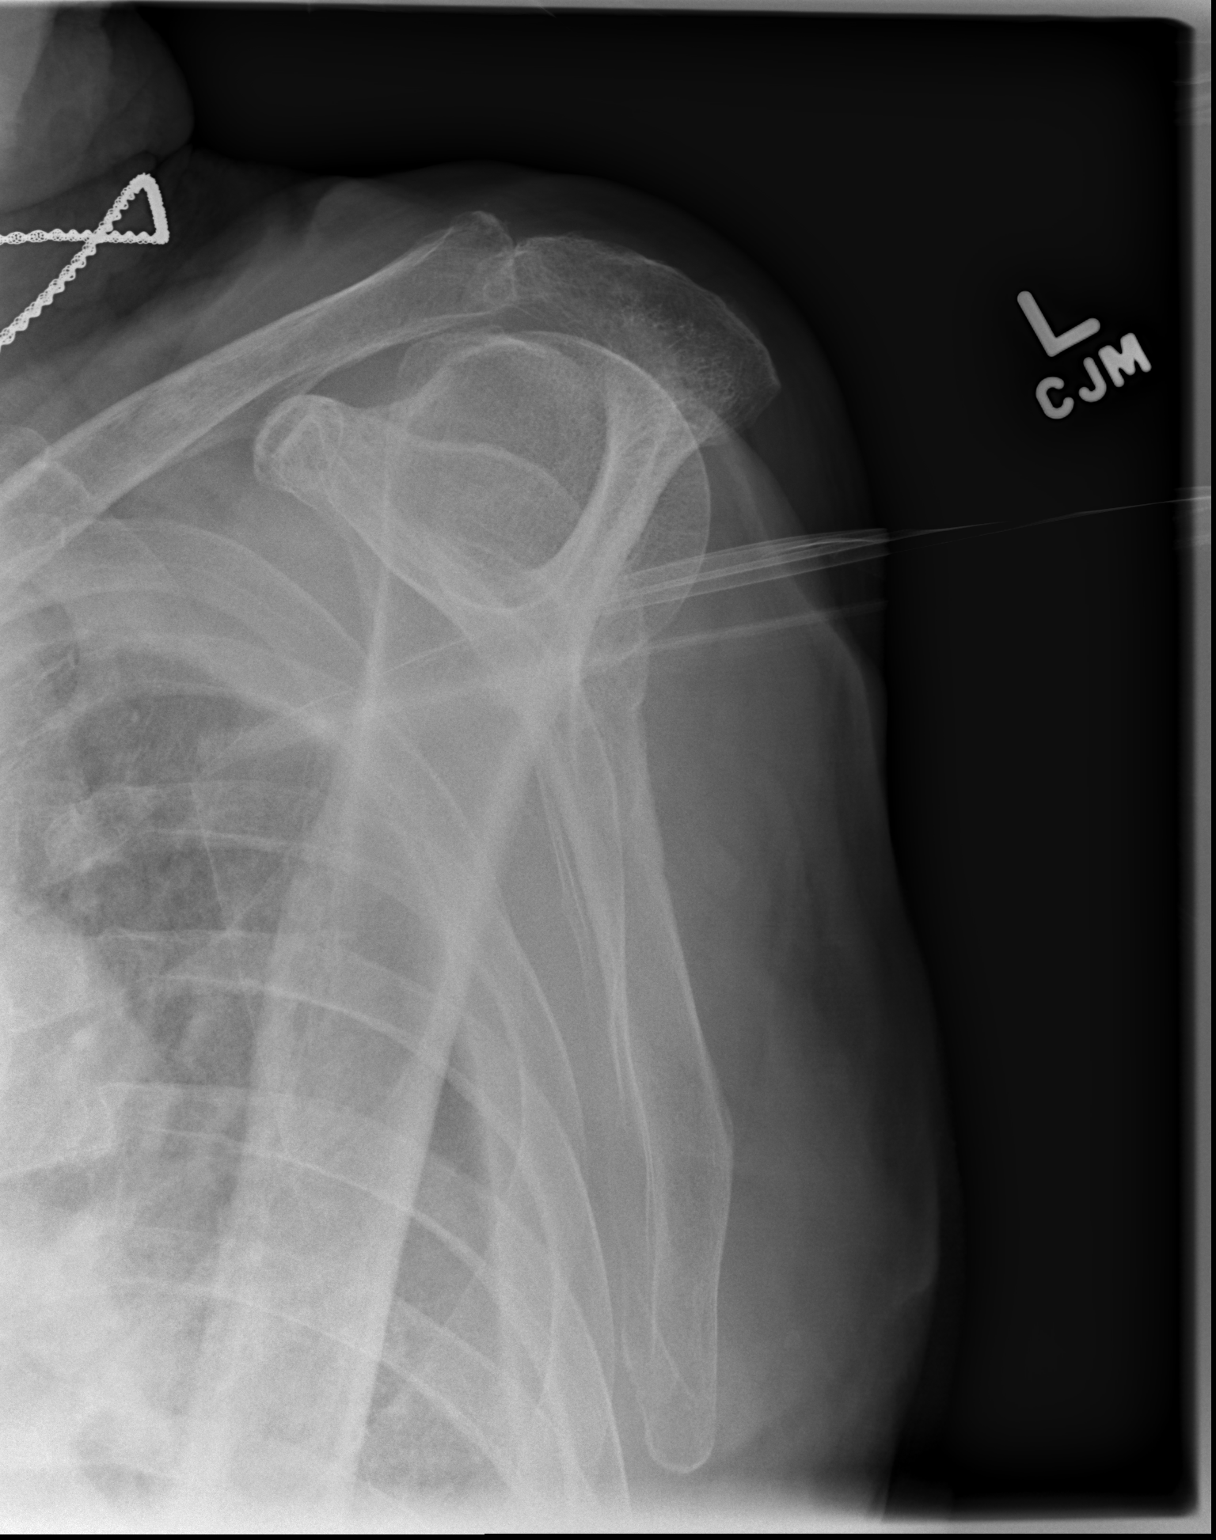

[x shoulder axillary left (2 of 2)]
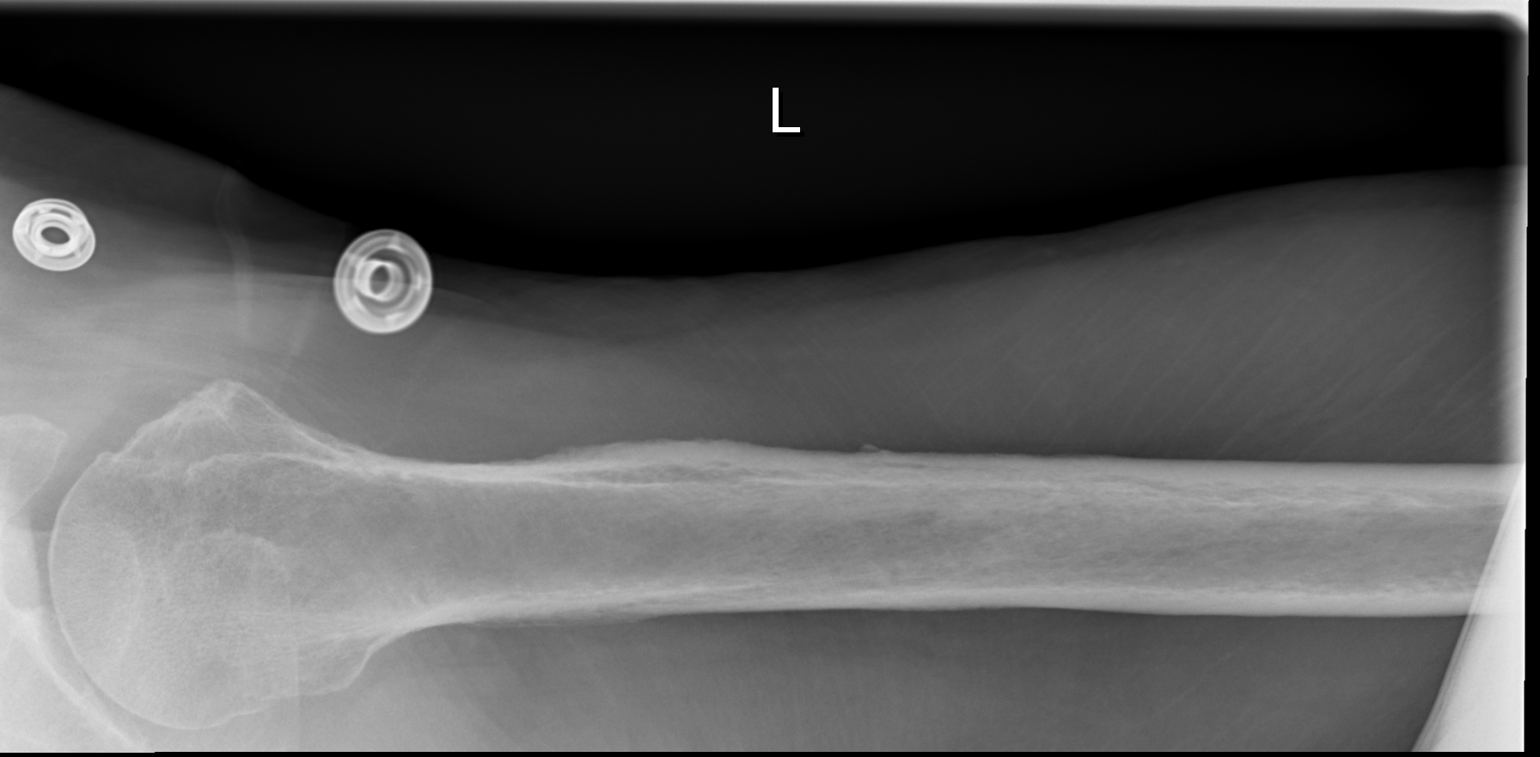

[3 of 3 positions shown; findings below may reference images not displayed]

FINDINGS: Three views of the left shoulder demonstrate no acute
displaced fracture, subluxation, dislocation, joint or soft tissue
abnormality.
IMPRESSION: 1.  No acute radiographic abnormality of the left shoulder.

## 2015-01-27 IMAGING — CR DG CHEST 1V PORT
1 series · 1 of 1 positions shown · non-contrast
Comparison: Prior chest x-ray 05/29/2013

CLINICAL DATA: Left pleural effusion

PORTABLE CHEST - 1 VIEW

[AP]
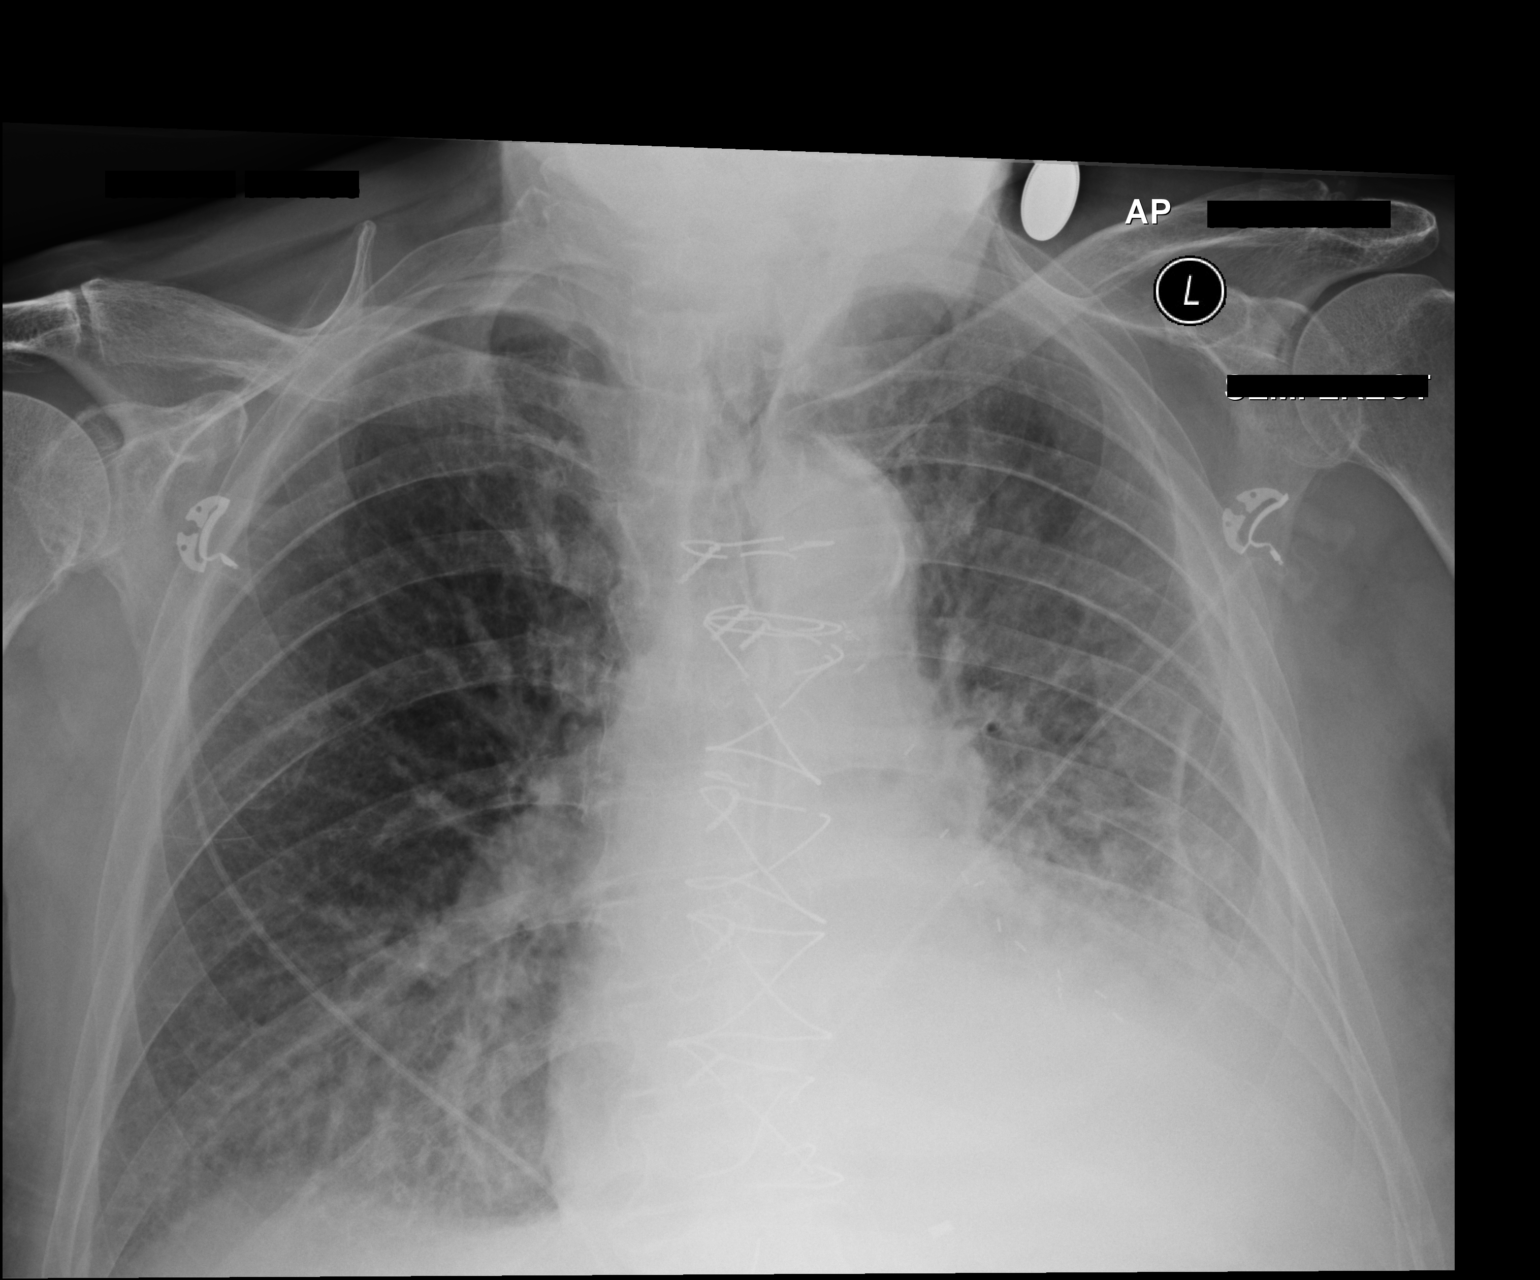

[1 of 1 positions shown; findings below may reference images not displayed]

FINDINGS: Stable moderately large left pleural effusion with
associated left basilar opacity.  Additionally, there is a layering
right pleural effusion with basilar atelectasis.  Unchanged
cardiomegaly in this patient status post median sternotomy for
multivessel CABG including [REDACTED] bypass.  Mild pulmonary vascular
congestion persists.  Aortic atherosclerosis.
IMPRESSION: 1.  Stable moderate left and smaller right layering pleural
effusions with associated bibasilar atelectasis.

2.  Stable cardiomegaly and pulmonary vascular congestion

## 2015-08-25 DEATH — deceased
